# Patient Record
Sex: Female | Born: 1950 | Race: White | Hispanic: No | Marital: Single | Smoking: Never smoker
Health system: Southern US, Community
[De-identification: ages and names within clinical notes are randomized; demographics above are authoritative.]

## PROBLEM LIST (undated history)

## (undated) DIAGNOSIS — M8580 Other specified disorders of bone density and structure, unspecified site: Secondary | ICD-10-CM

## (undated) DIAGNOSIS — R7303 Prediabetes: Secondary | ICD-10-CM

## (undated) DIAGNOSIS — I499 Cardiac arrhythmia, unspecified: Secondary | ICD-10-CM

## (undated) DIAGNOSIS — E785 Hyperlipidemia, unspecified: Secondary | ICD-10-CM

## (undated) DIAGNOSIS — R600 Localized edema: Secondary | ICD-10-CM

## (undated) DIAGNOSIS — H04563 Stenosis of bilateral lacrimal punctum: Secondary | ICD-10-CM

## (undated) DIAGNOSIS — C801 Malignant (primary) neoplasm, unspecified: Secondary | ICD-10-CM

## (undated) DIAGNOSIS — Z9889 Other specified postprocedural states: Secondary | ICD-10-CM

## (undated) DIAGNOSIS — R42 Dizziness and giddiness: Secondary | ICD-10-CM

## (undated) DIAGNOSIS — Z972 Presence of dental prosthetic device (complete) (partial): Secondary | ICD-10-CM

## (undated) DIAGNOSIS — R739 Hyperglycemia, unspecified: Secondary | ICD-10-CM

## (undated) DIAGNOSIS — K219 Gastro-esophageal reflux disease without esophagitis: Secondary | ICD-10-CM

## (undated) DIAGNOSIS — C8590 Non-Hodgkin lymphoma, unspecified, unspecified site: Secondary | ICD-10-CM

## (undated) DIAGNOSIS — R112 Nausea with vomiting, unspecified: Secondary | ICD-10-CM

## (undated) DIAGNOSIS — I38 Endocarditis, valve unspecified: Secondary | ICD-10-CM

## (undated) DIAGNOSIS — N189 Chronic kidney disease, unspecified: Secondary | ICD-10-CM

## (undated) DIAGNOSIS — J45909 Unspecified asthma, uncomplicated: Secondary | ICD-10-CM

## (undated) DIAGNOSIS — M1990 Unspecified osteoarthritis, unspecified site: Secondary | ICD-10-CM

## (undated) DIAGNOSIS — G4730 Sleep apnea, unspecified: Secondary | ICD-10-CM

## (undated) DIAGNOSIS — H02103 Unspecified ectropion of right eye, unspecified eyelid: Secondary | ICD-10-CM

## (undated) DIAGNOSIS — H02106 Unspecified ectropion of left eye, unspecified eyelid: Secondary | ICD-10-CM

## (undated) DIAGNOSIS — I10 Essential (primary) hypertension: Secondary | ICD-10-CM

## (undated) DIAGNOSIS — M109 Gout, unspecified: Secondary | ICD-10-CM

## (undated) HISTORY — PX: COLONOSCOPY: SHX174

## (undated) HISTORY — PX: CHOLECYSTECTOMY: SHX55

## (undated) HISTORY — PX: ABDOMINAL HYSTERECTOMY: SHX81

## (undated) HISTORY — PX: ESOPHAGOGASTRODUODENOSCOPY: SHX1529

## (undated) HISTORY — PX: CARPAL TUNNEL RELEASE: SHX101

## (undated) HISTORY — PX: OTHER SURGICAL HISTORY: SHX169

---

## 2004-04-05 ENCOUNTER — Encounter: Payer: Self-pay | Admitting: Emergency Medicine

## 2004-05-19 ENCOUNTER — Encounter: Payer: Self-pay | Admitting: Family Medicine

## 2004-07-09 ENCOUNTER — Encounter: Payer: Self-pay | Admitting: Neurology

## 2004-08-06 ENCOUNTER — Encounter: Payer: Self-pay | Admitting: Neurology

## 2004-09-03 ENCOUNTER — Encounter: Payer: Self-pay | Admitting: Neurology

## 2005-10-28 ENCOUNTER — Ambulatory Visit: Payer: Self-pay | Admitting: Gastroenterology

## 2005-11-26 ENCOUNTER — Ambulatory Visit: Payer: Self-pay | Admitting: Gastroenterology

## 2006-03-30 ENCOUNTER — Emergency Department: Payer: Self-pay | Admitting: Emergency Medicine

## 2006-07-08 ENCOUNTER — Emergency Department: Payer: Self-pay | Admitting: Emergency Medicine

## 2007-02-25 ENCOUNTER — Ambulatory Visit: Payer: Self-pay | Admitting: Emergency Medicine

## 2007-02-25 ENCOUNTER — Emergency Department: Payer: Self-pay | Admitting: Unknown Physician Specialty

## 2007-11-07 ENCOUNTER — Observation Stay: Payer: Self-pay | Admitting: Internal Medicine

## 2007-11-30 ENCOUNTER — Ambulatory Visit: Payer: Self-pay | Admitting: Internal Medicine

## 2008-10-26 ENCOUNTER — Ambulatory Visit: Payer: Self-pay | Admitting: Internal Medicine

## 2008-12-05 ENCOUNTER — Ambulatory Visit: Payer: Self-pay | Admitting: Internal Medicine

## 2008-12-18 ENCOUNTER — Ambulatory Visit: Payer: Self-pay | Admitting: Internal Medicine

## 2009-06-19 ENCOUNTER — Ambulatory Visit: Payer: Self-pay | Admitting: Internal Medicine

## 2010-01-02 ENCOUNTER — Ambulatory Visit: Payer: Self-pay | Admitting: Family Medicine

## 2010-02-13 ENCOUNTER — Ambulatory Visit: Payer: Self-pay | Admitting: Family Medicine

## 2010-11-04 ENCOUNTER — Ambulatory Visit: Payer: Self-pay | Admitting: Family Medicine

## 2011-01-08 ENCOUNTER — Ambulatory Visit: Payer: Self-pay | Admitting: Family Medicine

## 2011-01-26 ENCOUNTER — Emergency Department: Payer: Self-pay | Admitting: Emergency Medicine

## 2011-02-03 ENCOUNTER — Inpatient Hospital Stay: Payer: Self-pay | Admitting: Internal Medicine

## 2011-02-10 ENCOUNTER — Encounter: Payer: Self-pay | Admitting: Unknown Physician Specialty

## 2012-01-22 ENCOUNTER — Ambulatory Visit: Payer: Self-pay | Admitting: Family Medicine

## 2012-02-18 ENCOUNTER — Emergency Department: Payer: Self-pay | Admitting: Emergency Medicine

## 2012-03-10 ENCOUNTER — Ambulatory Visit: Payer: Self-pay | Admitting: Internal Medicine

## 2012-11-17 ENCOUNTER — Emergency Department: Payer: Self-pay | Admitting: Emergency Medicine

## 2012-11-17 LAB — URINALYSIS, COMPLETE
Bacteria: NONE SEEN
Bilirubin,UR: NEGATIVE
Blood: NEGATIVE
Ketone: NEGATIVE
Ph: 5 (ref 4.5–8.0)
Protein: NEGATIVE
RBC,UR: <1 /HPF (ref 0–5)
Squamous Epithelial: 1
WBC UR: 1 /HPF (ref 0–5)

## 2012-11-17 LAB — CBC
HCT: 44.5 % (ref 35.0–47.0)
MCH: 29.8 pg (ref 26.0–34.0)
MCV: 88 fL (ref 80–100)
Platelet: 198 10*3/uL (ref 150–440)
RBC: 5.06 10*6/uL (ref 3.80–5.20)
RDW: 12.8 % (ref 11.5–14.5)
WBC: 8.3 10*3/uL (ref 3.6–11.0)

## 2012-11-17 LAB — BASIC METABOLIC PANEL
BUN: 21 mg/dL — ABNORMAL HIGH (ref 7–18)
Calcium, Total: 9.5 mg/dL (ref 8.5–10.1)
EGFR (African American): 52 — ABNORMAL LOW
Glucose: 118 mg/dL — ABNORMAL HIGH (ref 65–99)
Potassium: 4 mmol/L (ref 3.5–5.1)

## 2012-11-17 LAB — TSH: Thyroid Stimulating Horm: 4.36 u[IU]/mL

## 2013-01-28 DIAGNOSIS — I10 Essential (primary) hypertension: Secondary | ICD-10-CM | POA: Insufficient documentation

## 2013-01-28 DIAGNOSIS — K219 Gastro-esophageal reflux disease without esophagitis: Secondary | ICD-10-CM | POA: Insufficient documentation

## 2013-02-22 ENCOUNTER — Ambulatory Visit: Payer: Self-pay | Admitting: Family Medicine

## 2013-03-03 ENCOUNTER — Ambulatory Visit: Payer: Self-pay | Admitting: Family Medicine

## 2014-01-09 DIAGNOSIS — M109 Gout, unspecified: Secondary | ICD-10-CM | POA: Insufficient documentation

## 2014-03-20 ENCOUNTER — Ambulatory Visit: Payer: Self-pay | Admitting: Gastroenterology

## 2014-03-22 LAB — PATHOLOGY REPORT

## 2014-04-04 DIAGNOSIS — E782 Mixed hyperlipidemia: Secondary | ICD-10-CM | POA: Insufficient documentation

## 2014-05-10 DIAGNOSIS — J4530 Mild persistent asthma, uncomplicated: Secondary | ICD-10-CM | POA: Insufficient documentation

## 2014-06-06 DIAGNOSIS — N183 Chronic kidney disease, stage 3 unspecified: Secondary | ICD-10-CM | POA: Insufficient documentation

## 2014-10-04 DIAGNOSIS — R600 Localized edema: Secondary | ICD-10-CM | POA: Insufficient documentation

## 2015-01-15 ENCOUNTER — Other Ambulatory Visit: Payer: Self-pay | Admitting: Family Medicine

## 2015-01-15 DIAGNOSIS — Z1231 Encounter for screening mammogram for malignant neoplasm of breast: Secondary | ICD-10-CM

## 2015-01-17 ENCOUNTER — Other Ambulatory Visit: Payer: Self-pay | Admitting: Family Medicine

## 2015-02-11 ENCOUNTER — Encounter: Payer: Self-pay | Admitting: *Deleted

## 2015-02-11 ENCOUNTER — Ambulatory Visit: Payer: 59 | Attending: Oncology | Admitting: *Deleted

## 2015-02-11 VITALS — BP 122/72 | HR 58 | Temp 96.7°F | Resp 16 | Ht 63.39 in | Wt 149.9 lb

## 2015-02-11 DIAGNOSIS — N644 Mastodynia: Secondary | ICD-10-CM

## 2015-02-11 NOTE — Progress Notes (Signed)
Subjective:     Patient ID: Tracy Curry, female   DOB: [birth year], 64 y.o.   MRN: [id number]  HPI   Review of Systems     Objective:   Physical Exam  Pulmonary/Chest: Right breast exhibits no inverted nipple, no mass, no nipple discharge, no skin change and no tenderness. Left breast exhibits tenderness. Left breast exhibits no inverted nipple, no mass, no nipple discharge and no skin change. Breasts are asymmetrical.         Assessment:     64 year old White female presents to [facility] with complaints of left axillary and upper outer quadrant pain of the left breast.  States the pain is intermittent and has been present for months.  Rates the pain a 2 on a 0/10 scale.  No aggravating or alleviating factors.  Reviewed last mammogram from [place] and patient has a history of breast cyst.  Last mammogram was a birads 2.  On clinical breast exam there is targeted tenderness in the left axilla and upper outer quadrant of the left breast. The right breast is at least one cup size larger than the left. Taught self breast awareness. Patient has been screened for eligibility.  She does not have any insurance, Medicare or Medicaid.  She also meets financial eligibility.  Hand-out given on the Affordable Care Act.      Plan:     Bilateral diagnostic mammogram with ultrasound for targeted left breast and axillary pain on 02/13/15.  Will follow-up per protocol.

## 2015-02-11 NOTE — Patient Instructions (Signed)
Gave patient hand-out, Women Staying Healthy, Active and Well from [facility], with education on breast health, pap smears, heart and colon health. 

## 2015-02-13 ENCOUNTER — Ambulatory Visit
Admission: RE | Admit: 2015-02-13 | Discharge: 2015-02-13 | Disposition: A | Discharge status: Home / Self Care | Payer: Self-pay | Source: Ambulatory Visit | Attending: Oncology | Admitting: Oncology

## 2015-02-13 ENCOUNTER — Ambulatory Visit: Payer: Self-pay

## 2015-02-13 DIAGNOSIS — N644 Mastodynia: Secondary | ICD-10-CM | POA: Insufficient documentation

## 2015-02-18 NOTE — Progress Notes (Signed)
Reviewed normal mammogram and ultrasound results with the patient.  She is to call me back if the pain worsens, or there is any palpable findings.  She is agreeable.  Otherwise she is to follow-up in one year with annual screening.  HSIS to [place].

## 2016-01-02 ENCOUNTER — Other Ambulatory Visit: Payer: Self-pay | Admitting: Family Medicine

## 2016-01-02 DIAGNOSIS — Z1231 Encounter for screening mammogram for malignant neoplasm of breast: Secondary | ICD-10-CM

## 2016-02-17 ENCOUNTER — Ambulatory Visit
Admission: RE | Admit: 2016-02-17 | Discharge: 2016-02-17 | Disposition: A | Discharge status: Home / Self Care | Payer: PPO | Source: Ambulatory Visit | Attending: Family Medicine | Admitting: Family Medicine

## 2016-02-17 DIAGNOSIS — Z1231 Encounter for screening mammogram for malignant neoplasm of breast: Secondary | ICD-10-CM | POA: Insufficient documentation

## 2016-02-17 HISTORY — DX: Malignant (primary) neoplasm, unspecified: C80.1

## 2016-04-14 ENCOUNTER — Ambulatory Visit: Payer: PPO | Attending: Otolaryngology

## 2016-04-14 DIAGNOSIS — G4719 Other hypersomnia: Secondary | ICD-10-CM | POA: Diagnosis present

## 2016-04-14 DIAGNOSIS — R0683 Snoring: Secondary | ICD-10-CM | POA: Diagnosis not present

## 2016-04-14 DIAGNOSIS — G4733 Obstructive sleep apnea (adult) (pediatric): Secondary | ICD-10-CM | POA: Insufficient documentation

## 2016-06-17 ENCOUNTER — Other Ambulatory Visit: Payer: Self-pay | Admitting: Family Medicine

## 2016-06-17 DIAGNOSIS — R944 Abnormal results of kidney function studies: Secondary | ICD-10-CM

## 2016-06-17 DIAGNOSIS — R1012 Left upper quadrant pain: Secondary | ICD-10-CM

## 2016-06-17 DIAGNOSIS — D7389 Other diseases of spleen: Secondary | ICD-10-CM

## 2016-06-22 ENCOUNTER — Ambulatory Visit
Admission: RE | Admit: 2016-06-22 | Discharge: 2016-06-22 | Disposition: A | Discharge status: Home / Self Care | Payer: PPO | Source: Ambulatory Visit | Attending: Family Medicine | Admitting: Family Medicine

## 2016-06-22 DIAGNOSIS — R1012 Left upper quadrant pain: Secondary | ICD-10-CM | POA: Diagnosis present

## 2016-06-22 DIAGNOSIS — R944 Abnormal results of kidney function studies: Secondary | ICD-10-CM

## 2016-06-22 DIAGNOSIS — D7389 Other diseases of spleen: Secondary | ICD-10-CM

## 2016-12-14 ENCOUNTER — Encounter: Payer: Self-pay | Admitting: *Deleted

## 2016-12-15 ENCOUNTER — Encounter: Payer: Self-pay | Admitting: *Deleted

## 2016-12-15 ENCOUNTER — Ambulatory Visit
Admission: RE | Admit: 2016-12-15 | Discharge: 2016-12-15 | Disposition: A | Discharge status: Home / Self Care | Payer: Medicare PPO | Source: Ambulatory Visit | Attending: Gastroenterology | Admitting: Gastroenterology

## 2016-12-15 ENCOUNTER — Ambulatory Visit: Payer: Medicare PPO | Admitting: Anesthesiology

## 2016-12-15 ENCOUNTER — Encounter
Admission: RE | Disposition: A | Discharge status: Home / Self Care | Payer: Self-pay | Source: Ambulatory Visit | Attending: Gastroenterology

## 2016-12-15 DIAGNOSIS — Z7982 Long term (current) use of aspirin: Secondary | ICD-10-CM | POA: Diagnosis not present

## 2016-12-15 DIAGNOSIS — Z888 Allergy status to other drugs, medicaments and biological substances status: Secondary | ICD-10-CM | POA: Diagnosis not present

## 2016-12-15 DIAGNOSIS — Z882 Allergy status to sulfonamides status: Secondary | ICD-10-CM | POA: Diagnosis not present

## 2016-12-15 DIAGNOSIS — I129 Hypertensive chronic kidney disease with stage 1 through stage 4 chronic kidney disease, or unspecified chronic kidney disease: Secondary | ICD-10-CM | POA: Diagnosis not present

## 2016-12-15 DIAGNOSIS — N189 Chronic kidney disease, unspecified: Secondary | ICD-10-CM | POA: Insufficient documentation

## 2016-12-15 DIAGNOSIS — Z85828 Personal history of other malignant neoplasm of skin: Secondary | ICD-10-CM | POA: Insufficient documentation

## 2016-12-15 DIAGNOSIS — J45909 Unspecified asthma, uncomplicated: Secondary | ICD-10-CM | POA: Diagnosis not present

## 2016-12-15 DIAGNOSIS — M109 Gout, unspecified: Secondary | ICD-10-CM | POA: Insufficient documentation

## 2016-12-15 DIAGNOSIS — Z885 Allergy status to narcotic agent status: Secondary | ICD-10-CM | POA: Diagnosis not present

## 2016-12-15 DIAGNOSIS — Z881 Allergy status to other antibiotic agents status: Secondary | ICD-10-CM | POA: Insufficient documentation

## 2016-12-15 DIAGNOSIS — M8580 Other specified disorders of bone density and structure, unspecified site: Secondary | ICD-10-CM | POA: Diagnosis not present

## 2016-12-15 DIAGNOSIS — Z79899 Other long term (current) drug therapy: Secondary | ICD-10-CM | POA: Diagnosis not present

## 2016-12-15 DIAGNOSIS — K3189 Other diseases of stomach and duodenum: Secondary | ICD-10-CM | POA: Diagnosis not present

## 2016-12-15 DIAGNOSIS — Q398 Other congenital malformations of esophagus: Secondary | ICD-10-CM | POA: Insufficient documentation

## 2016-12-15 DIAGNOSIS — E785 Hyperlipidemia, unspecified: Secondary | ICD-10-CM | POA: Insufficient documentation

## 2016-12-15 DIAGNOSIS — K2950 Unspecified chronic gastritis without bleeding: Secondary | ICD-10-CM | POA: Diagnosis not present

## 2016-12-15 DIAGNOSIS — K210 Gastro-esophageal reflux disease with esophagitis: Secondary | ICD-10-CM | POA: Diagnosis not present

## 2016-12-15 DIAGNOSIS — R001 Bradycardia, unspecified: Secondary | ICD-10-CM | POA: Diagnosis not present

## 2016-12-15 DIAGNOSIS — R1013 Epigastric pain: Secondary | ICD-10-CM | POA: Diagnosis present

## 2016-12-15 DIAGNOSIS — Z880 Allergy status to penicillin: Secondary | ICD-10-CM | POA: Diagnosis not present

## 2016-12-15 HISTORY — DX: Localized edema: R60.0

## 2016-12-15 HISTORY — DX: Other specified disorders of bone density and structure, unspecified site: M85.80

## 2016-12-15 HISTORY — DX: Unspecified ectropion of right eye, unspecified eyelid: H02.103

## 2016-12-15 HISTORY — DX: Essential (primary) hypertension: I10

## 2016-12-15 HISTORY — DX: Stenosis of bilateral lacrimal punctum: H04.563

## 2016-12-15 HISTORY — DX: Gastro-esophageal reflux disease without esophagitis: K21.9

## 2016-12-15 HISTORY — DX: Unspecified asthma, uncomplicated: J45.909

## 2016-12-15 HISTORY — DX: Unspecified ectropion of left eye, unspecified eyelid: H02.106

## 2016-12-15 HISTORY — DX: Cardiac arrhythmia, unspecified: I49.9

## 2016-12-15 HISTORY — PX: ESOPHAGOGASTRODUODENOSCOPY (EGD) WITH PROPOFOL: SHX5813

## 2016-12-15 HISTORY — DX: Hyperlipidemia, unspecified: E78.5

## 2016-12-15 HISTORY — DX: Gout, unspecified: M10.9

## 2016-12-15 HISTORY — DX: Chronic kidney disease, unspecified: N18.9

## 2016-12-15 HISTORY — DX: Hyperglycemia, unspecified: R73.9

## 2016-12-15 HISTORY — DX: Endocarditis, valve unspecified: I38

## 2016-12-15 SURGERY — ESOPHAGOGASTRODUODENOSCOPY (EGD) WITH PROPOFOL
Anesthesia: General

## 2016-12-15 MED ORDER — MIDAZOLAM HCL 5 MG/5ML IJ SOLN
INTRAMUSCULAR | Status: DC | PRN
  Administered 2016-12-15: 1 mg via INTRAVENOUS

## 2016-12-15 MED ORDER — PROPOFOL 500 MG/50ML IV EMUL
INTRAVENOUS | Status: DC | PRN
  Administered 2016-12-15: 140 ug/kg/min via INTRAVENOUS

## 2016-12-15 MED ORDER — PROPOFOL 500 MG/50ML IV EMUL
INTRAVENOUS | Status: AC
  Filled 2016-12-15: qty 50

## 2016-12-15 MED ORDER — FENTANYL CITRATE (PF) 100 MCG/2ML IJ SOLN
INTRAMUSCULAR | Status: DC | PRN
  Administered 2016-12-15: 50 ug via INTRAVENOUS

## 2016-12-15 MED ORDER — SODIUM CHLORIDE 0.9 % IV SOLN
INTRAVENOUS | Status: DC
  Administered 2016-12-15: 1000 mL via INTRAVENOUS
  Administered 2016-12-15: 14:00:00 via INTRAVENOUS

## 2016-12-15 MED ORDER — FENTANYL CITRATE (PF) 100 MCG/2ML IJ SOLN
INTRAMUSCULAR | Status: AC
  Filled 2016-12-15: qty 2

## 2016-12-15 MED ORDER — MIDAZOLAM HCL 2 MG/2ML IJ SOLN
INTRAMUSCULAR | Status: AC
  Filled 2016-12-15: qty 2

## 2016-12-15 MED ORDER — PROPOFOL 10 MG/ML IV BOLUS
INTRAVENOUS | Status: DC | PRN
  Administered 2016-12-15: 100 mg via INTRAVENOUS

## 2016-12-15 MED ORDER — LIDOCAINE 2% (20 MG/ML) 5 ML SYRINGE
INTRAMUSCULAR | Status: DC | PRN
  Administered 2016-12-15: 40 mg via INTRAVENOUS

## 2016-12-15 NOTE — Transfer of Care (Signed)
Immediate Anesthesia Transfer of Care Note  Patient: Tracy Curry  Procedure(s) Performed: Procedure(s): ESOPHAGOGASTRODUODENOSCOPY (EGD) WITH PROPOFOL (N/A)  Patient Location: PACU and Endoscopy Unit  Anesthesia Type:General  Level of Consciousness: sedated  Airway & Oxygen Therapy: Patient Spontanous Breathing and Patient connected to nasal cannula oxygen  Post-op Assessment: Report given to RN and Post -op Vital signs reviewed and stable  Post vital signs: Reviewed and stable  Last Vitals:  Vitals:   12/15/16 1257  BP: (!) 168/72  Pulse: 68  Resp: 16  Temp: 36.7 C    Last Pain:  Vitals:   12/15/16 1257  TempSrc: Oral  PainSc: 8          Complications: No apparent anesthesia complications

## 2016-12-15 NOTE — Op Note (Signed)
[facility] Patient Name: Tracy Curry Procedure Date: 12/15/2016 2:16 PM MRN: [id number] Account #: [id number] Date of Birth: 03-16-1951 Admit Type: Outpatient Age: 66 Room: [facility] ENDO ROOM 3 Gender: Female Note Status: Finalized Procedure:            Upper GI endoscopy Indications:          Epigastric abdominal pain, Dyspepsia Providers:            Lollie Sails, MD Referring MD:         Lynnell Jude (Referring MD) Medicines:            Monitored Anesthesia Care Complications:        No immediate complications. Procedure:            Pre-Anesthesia Assessment:                       - ASA Grade Assessment: II - A patient with mild                        systemic disease.                       After obtaining informed consent, the endoscope was                        passed under direct vision. Throughout the procedure,                        the patient's blood pressure, pulse, and oxygen                        saturations were monitored continuously. The Endoscope                        was introduced through the mouth, and advanced to the                        third part of duodenum. The upper GI endoscopy was                        accomplished without difficulty. The patient tolerated                        the procedure well. Findings:      The Z-line was variable. Biopsies were taken with a cold forceps for       histology.      The lower third of the esophagus was moderately tortuous.      Diffuse and patchy mild inflammation characterized by congestion (edema)       and erythema was found in the gastric body and in the gastric antrum.      The cardia and gastric fundus were normal on retroflexion.      The examined duodenum was normal. Impression:           - Z-line variable. Biopsied.                       - Tortuous esophagus.                       - Bile gastritis.                       -  Normal examined  duodenum. Recommendation:       - Use Protonix (pantoprazole) 40 mg PO daily daily.                       - Use sucralfate tablets 1 gram PO BID.                       - Return to GI clinic in 5 weeks. Procedure Code(s):    --- Professional ---                       [phone], Esophagogastroduodenoscopy, flexible, transoral;                        with biopsy, single or multiple Diagnosis Code(s):    --- Professional ---                       K22.8, Other specified diseases of esophagus                       Q39.9, Congenital malformation of esophagus, unspecified                       K29.60, Other gastritis without bleeding                       R10.13, Epigastric pain CPT copyright 2016 American Medical Association. All rights reserved. The codes documented in this report are preliminary and upon coder review may  be revised to meet current compliance requirements. Lollie Sails, MD 12/15/2016 2:51:27 PM This report has been signed electronically. Number of Addenda: 0 Note Initiated On: 12/15/2016 2:16 PM      [facility]

## 2016-12-15 NOTE — Anesthesia Post-op Follow-up Note (Cosign Needed)
Anesthesia QCDR form completed.        

## 2016-12-15 NOTE — Anesthesia Preprocedure Evaluation (Signed)
Anesthesia Evaluation  Patient identified by MRN, date of birth, ID band Patient awake    Reviewed: Allergy & Precautions  Airway Mallampati: II       Dental  (+) Upper Dentures, Lower Dentures   Pulmonary asthma ,    breath sounds clear to auscultation       Cardiovascular Exercise Tolerance: Good hypertension, Pt. on medications + dysrhythmias  Rhythm:Regular Rate:Bradycardia     Neuro/Psych negative neurological ROS     GI/Hepatic Neg liver ROS, GERD  Medicated,  Endo/Other  negative endocrine ROS  Renal/GU      Musculoskeletal negative musculoskeletal ROS (+)   Abdominal Normal abdominal exam  (+)   Peds  Hematology negative hematology ROS (+)   Anesthesia Other Findings   Reproductive/Obstetrics                             Anesthesia Physical Anesthesia Plan  ASA: II  Anesthesia Plan: General   Post-op Pain Management:    Induction: Intravenous  PONV Risk Score and Plan: 1 and Ondansetron  Airway Management Planned: Natural Airway  Additional Equipment:   Intra-op Plan:   Post-operative Plan:   Informed Consent: I have reviewed the patients History and Physical, chart, labs and discussed the procedure including the risks, benefits and alternatives for the proposed anesthesia with the patient or authorized representative who has indicated his/her understanding and acceptance.     Plan Discussed with: CRNA  Anesthesia Plan Comments:         Anesthesia Quick Evaluation

## 2016-12-15 NOTE — Anesthesia Postprocedure Evaluation (Signed)
Anesthesia Post Note  Patient: Tracy Curry  Procedure(s) Performed: Procedure(s) (LRB): ESOPHAGOGASTRODUODENOSCOPY (EGD) WITH PROPOFOL (N/A)  Patient location during evaluation: Endoscopy Anesthesia Type: General Level of consciousness: awake and alert Pain management: pain level controlled Vital Signs Assessment: post-procedure vital signs reviewed and stable Respiratory status: spontaneous breathing, nonlabored ventilation, respiratory function stable and patient connected to nasal cannula oxygen Cardiovascular status: blood pressure returned to baseline and stable Postop Assessment: no signs of nausea or vomiting Anesthetic complications: no     Last Vitals:  Vitals:   12/15/16 1515 12/15/16 1525  BP: (!) 144/53 93/80  Pulse: 63 65  Resp: 16 17  Temp:      Last Pain:  Vitals:   12/15/16 1455  TempSrc: Tympanic  PainSc:                  [name]

## 2016-12-15 NOTE — H&P (Signed)
Outpatient short stay form Pre-procedure 12/15/2016 2:06 PM Lollie Sails MD  Primary Physician: Dr. Lavera Guise  Reason for visit:  EGD  History of present illness:  Patient is a 66 year old female presenting today as above. She has a history of having chronic gastritis in the past. She presents with epigastric pain and dyspepsia. There is no nausea or vomiting. She does take occasional NSAID. She does take 81 mg aspirin daily that is been held for several days. Patient had been taking Pepcid however was recently changed to pantoprazole 40 mg today this has decreased her symptoms from a level X to a level II.    Current Facility-Administered Medications:  .  0.9 %  sodium chloride infusion, , Intravenous, Continuous, Lollie Sails, MD, Last Rate: 20 mL/hr at 12/15/16 1314  Prescriptions Prior to Admission  Medication Sig Dispense Refill Last Dose  . allopurinol (ZYLOPRIM) 300 MG tablet Take 300 mg by mouth daily.     Marland Kitchen amLODipine (NORVASC) 10 MG tablet Take 10 mg by mouth daily.   12/15/2016 at 0315  . aspirin EC 81 MG tablet Take 81 mg by mouth daily.     . calcium carbonate (OSCAL) 1500 (600 Ca) MG TABS tablet Take by mouth 2 (two) times daily with a meal.     . cloNIDine (CATAPRES - DOSED IN MG/24 HR) 0.2 mg/24hr patch Place 0.2 mg onto the skin once a week.   12/15/2016 at 0315  . famotidine (PEPCID) 40 MG tablet Take 40 mg by mouth daily.     Marland Kitchen ketotifen (ZADITOR) 0.025 % ophthalmic solution 1 drop 2 (two) times daily.     . magnesium oxide (MAG-OX) 400 MG tablet Take 400 mg by mouth daily.     . metoprolol succinate (TOPROL-XL) 25 MG 24 hr tablet Take 25 mg by mouth daily.   12/14/2016 at Unknown time  . Multiple Vitamin (MULTIVITAMIN) tablet Take 1 tablet by mouth daily.     . niacin 500 MG tablet Take 500 mg by mouth at bedtime.     . OMEGA-3 FATTY ACIDS-VITAMIN E PO Take 1,200 mg by mouth daily.     . prednisoLONE acetate (PRED FORTE) 1 % ophthalmic suspension 1 drop 4  (four) times daily.     . [DISCONTINUED] RABEprazole (ACIPHEX) 20 MG tablet Take 20 mg by mouth daily.        Allergies  Allergen Reactions  . Gabapentin Rash    Other Reaction: GI UPSET  . Oxycodone-Acetaminophen Rash  . Ace Inhibitors Other (See Comments)  . Macrolides And Ketolides Other (See Comments)  . Pantoprazole Other (See Comments)    Abdominal pain  . Penicillins Hives  . Petrolatum-Zinc Oxide Nausea And Vomiting  . Propylene Glycol Hives  . Sulfa Antibiotics Other (See Comments)    unknown  . Tenex [Guanfacine Hcl] Other (See Comments)  . Tetracycline Nausea And Vomiting  . Amlactin [Ammonium Lactate] Rash  . Bacitracin Rash    Allergen test  . Benzyl Alcohol Rash    Allergen test  . Cetyl Alcohol Rash    Allergen test  . Erythromycin Rash    Other Reaction: OTHER REACTION-MOUTH SORES  . Lanolin Rash    Allergen test  . Neomycin Rash    Allergen test  . Petrolatum Rash  . Theraseal [Simethicone] Rash  . Ureacin [Urea] Rash     Past Medical History:  Diagnosis Date  . Asthma   . Bilateral leg edema   . Cancer ([place])  skin ca  . Chronic kidney disease    stage 3  . Dysrhythmia    bradycardia  . Ectropion of both eyes   . GERD (gastroesophageal reflux disease)   . Gout   . Hyperglycemia   . Hyperlipidemia   . Hypertension   . Osteopenia   . Stenosis of lacrimal punctum, bilateral   . VHD (valvular heart disease)     Review of systems:      Physical Exam    Heart and lungs: Regular rate and rhythm without rub or gallop, lungs are bilaterally clear.    HEENT: Normocephalic atraumatic eyes are anicteric    Other:     Pertinant exam for procedure: Soft mild tenderness throughout the abdomen and mostly in the lower epigastric region. nondistended, bowel sounds are positive normoactive    Planned proceedures: EGD and indicated procedures. I have discussed the risks benefits and complications of procedures to include not limited to  bleeding, infection, perforation and the risk of sedation and the patient wishes to proceed.    Lollie Sails, MD Gastroenterology 12/15/2016  2:06 PM

## 2016-12-16 ENCOUNTER — Encounter: Payer: Self-pay | Admitting: Gastroenterology

## 2016-12-18 LAB — SURGICAL PATHOLOGY

## 2017-01-07 ENCOUNTER — Other Ambulatory Visit: Payer: Self-pay | Admitting: Family Medicine

## 2017-01-07 DIAGNOSIS — Z1231 Encounter for screening mammogram for malignant neoplasm of breast: Secondary | ICD-10-CM

## 2017-01-25 ENCOUNTER — Other Ambulatory Visit: Payer: Self-pay | Admitting: Gastroenterology

## 2017-01-25 DIAGNOSIS — R1084 Generalized abdominal pain: Secondary | ICD-10-CM

## 2017-01-28 ENCOUNTER — Ambulatory Visit
Admission: RE | Admit: 2017-01-28 | Discharge: 2017-01-28 | Disposition: A | Discharge status: Home / Self Care | Payer: Medicare PPO | Source: Ambulatory Visit | Attending: Gastroenterology | Admitting: Gastroenterology

## 2017-01-28 ENCOUNTER — Other Ambulatory Visit
Admission: RE | Admit: 2017-01-28 | Discharge: 2017-01-28 | Disposition: A | Discharge status: Home / Self Care | Payer: Medicare PPO | Source: Ambulatory Visit | Attending: Gastroenterology | Admitting: Gastroenterology

## 2017-01-28 DIAGNOSIS — I700 Atherosclerosis of aorta: Secondary | ICD-10-CM | POA: Diagnosis not present

## 2017-01-28 DIAGNOSIS — K5750 Diverticulosis of both small and large intestine without perforation or abscess without bleeding: Secondary | ICD-10-CM | POA: Insufficient documentation

## 2017-01-28 DIAGNOSIS — R1084 Generalized abdominal pain: Secondary | ICD-10-CM

## 2017-01-28 LAB — CREATININE, SERUM
Creatinine, Ser: 0.91 mg/dL (ref 0.44–1.00)
GFR calc non Af Amer: >60 mL/min (ref >60)

## 2017-01-28 MED ORDER — IOPAMIDOL (ISOVUE-300) INJECTION 61%
100.0000 mL | Freq: Once | INTRAVENOUS | Status: AC | PRN
  Administered 2017-01-28: 100 mL via INTRAVENOUS

## 2017-02-23 ENCOUNTER — Ambulatory Visit
Admission: RE | Admit: 2017-02-23 | Discharge: 2017-02-23 | Disposition: A | Discharge status: Home / Self Care | Payer: Medicare PPO | Source: Ambulatory Visit | Attending: Family Medicine | Admitting: Family Medicine

## 2017-02-23 DIAGNOSIS — Z1231 Encounter for screening mammogram for malignant neoplasm of breast: Secondary | ICD-10-CM | POA: Diagnosis not present

## 2017-09-09 ENCOUNTER — Ambulatory Visit: Payer: Medicare HMO | Admitting: Gastroenterology

## 2017-10-21 DIAGNOSIS — C8590 Non-Hodgkin lymphoma, unspecified, unspecified site: Secondary | ICD-10-CM

## 2017-10-21 HISTORY — DX: Non-Hodgkin lymphoma, unspecified, unspecified site: C85.90

## 2017-11-23 DIAGNOSIS — C8580 Other specified types of non-Hodgkin lymphoma, unspecified site: Secondary | ICD-10-CM | POA: Insufficient documentation

## 2018-01-17 ENCOUNTER — Other Ambulatory Visit: Payer: Self-pay | Admitting: Family Medicine

## 2018-01-17 DIAGNOSIS — Z1231 Encounter for screening mammogram for malignant neoplasm of breast: Secondary | ICD-10-CM

## 2018-02-24 ENCOUNTER — Encounter (INDEPENDENT_AMBULATORY_CARE_PROVIDER_SITE_OTHER): Payer: Self-pay

## 2018-02-24 ENCOUNTER — Ambulatory Visit
Admission: RE | Admit: 2018-02-24 | Discharge: 2018-02-24 | Disposition: A | Discharge status: Home / Self Care | Payer: Medicare HMO | Source: Ambulatory Visit | Attending: Family Medicine | Admitting: Family Medicine

## 2018-02-24 DIAGNOSIS — Z1231 Encounter for screening mammogram for malignant neoplasm of breast: Secondary | ICD-10-CM | POA: Diagnosis not present

## 2018-02-24 HISTORY — DX: Non-Hodgkin lymphoma, unspecified, unspecified site: C85.90

## 2018-03-03 ENCOUNTER — Encounter: Payer: Self-pay | Admitting: *Deleted

## 2018-03-03 ENCOUNTER — Other Ambulatory Visit: Payer: Self-pay

## 2018-03-04 NOTE — Anesthesia Preprocedure Evaluation (Deleted)

## 2018-03-08 DIAGNOSIS — Z9989 Dependence on other enabling machines and devices: Secondary | ICD-10-CM | POA: Diagnosis not present

## 2018-03-08 DIAGNOSIS — D1809 Hemangioma of other sites: Secondary | ICD-10-CM | POA: Diagnosis not present

## 2018-03-08 DIAGNOSIS — I10 Essential (primary) hypertension: Secondary | ICD-10-CM

## 2018-03-08 DIAGNOSIS — Z7982 Long term (current) use of aspirin: Secondary | ICD-10-CM | POA: Diagnosis not present

## 2018-03-08 DIAGNOSIS — Z8719 Personal history of other diseases of the digestive system: Secondary | ICD-10-CM | POA: Diagnosis not present

## 2018-03-08 DIAGNOSIS — Z882 Allergy status to sulfonamides status: Secondary | ICD-10-CM | POA: Diagnosis not present

## 2018-03-08 DIAGNOSIS — Z888 Allergy status to other drugs, medicaments and biological substances status: Secondary | ICD-10-CM | POA: Diagnosis not present

## 2018-03-08 DIAGNOSIS — J45909 Unspecified asthma, uncomplicated: Secondary | ICD-10-CM | POA: Diagnosis not present

## 2018-03-08 DIAGNOSIS — K219 Gastro-esophageal reflux disease without esophagitis: Secondary | ICD-10-CM | POA: Diagnosis not present

## 2018-03-08 DIAGNOSIS — M1990 Unspecified osteoarthritis, unspecified site: Secondary | ICD-10-CM | POA: Diagnosis not present

## 2018-03-08 DIAGNOSIS — Z85828 Personal history of other malignant neoplasm of skin: Secondary | ICD-10-CM | POA: Diagnosis not present

## 2018-03-08 DIAGNOSIS — E7800 Pure hypercholesterolemia, unspecified: Secondary | ICD-10-CM | POA: Diagnosis not present

## 2018-03-08 DIAGNOSIS — Z880 Allergy status to penicillin: Secondary | ICD-10-CM | POA: Diagnosis not present

## 2018-03-08 DIAGNOSIS — G4730 Sleep apnea, unspecified: Secondary | ICD-10-CM | POA: Diagnosis not present

## 2018-03-08 DIAGNOSIS — J349 Unspecified disorder of nose and nasal sinuses: Secondary | ICD-10-CM | POA: Diagnosis present

## 2018-03-08 DIAGNOSIS — Z79899 Other long term (current) drug therapy: Secondary | ICD-10-CM | POA: Diagnosis not present

## 2018-03-09 NOTE — Discharge Instructions (Signed)
General Anesthesia, Adult, Care After °These instructions provide you with information about caring for yourself after your procedure. Your health care provider may also give you more specific instructions. Your treatment has been planned according to current medical practices, but problems sometimes occur. Call your health care provider if you have any problems or questions after your procedure. °What can I expect after the procedure? °After the procedure, it is common to have: °· Vomiting. °· A sore throat. °· Mental slowness. ° °It is common to feel: °· Nauseous. °· Cold or shivery. °· Sleepy. °· Tired. °· Sore or achy, even in parts of your body where you did not have surgery. ° °Follow these instructions at home: °For at least 24 hours after the procedure: °· Do not: °? Participate in activities where you could fall or become injured. °? Drive. °? Use heavy machinery. °? Drink alcohol. °? Take sleeping pills or medicines that cause drowsiness. °? Make important decisions or sign legal documents. °? Take care of children on your own. °· Rest. °Eating and drinking °· If you vomit, drink water, juice, or soup when you can drink without vomiting. °· Drink enough fluid to keep your urine clear or pale yellow. °· Make sure you have little or no nausea before eating solid foods. °· Follow the diet recommended by your health care provider. °General instructions °· Have a responsible adult stay with you until you are awake and alert. °· Return to your normal activities as told by your health care provider. Ask your health care provider what activities are safe for you. °· Take over-the-counter and prescription medicines only as told by your health care provider. °· If you smoke, do not smoke without supervision. °· Keep all follow-up visits as told by your health care provider. This is important. °Contact a health care provider if: °· You continue to have nausea or vomiting at home, and medicines are not helpful. °· You  cannot drink fluids or start eating again. °· You cannot urinate after 8-12 hours. °· You develop a skin rash. °· You have fever. °· You have increasing redness at the site of your procedure. °Get help right away if: °· You have difficulty breathing. °· You have chest pain. °· You have unexpected bleeding. °· You feel that you are having a life-threatening or urgent problem. °This information is not intended to replace advice given to you by your health care provider. Make sure you discuss any questions you have with your health care provider. °Document Released: 09/28/2000 Document Revised: 11/25/2015 Document Reviewed: 06/06/2015 °Elsevier Interactive Patient Education © 2018 [employer] Inc. ° °

## 2018-03-10 ENCOUNTER — Encounter
Admission: RE | Disposition: A | Discharge status: Home / Self Care | Payer: Self-pay | Source: Ambulatory Visit | Attending: Otolaryngology

## 2018-03-10 ENCOUNTER — Ambulatory Visit: Payer: Medicare HMO | Admitting: Anesthesiology

## 2018-03-10 ENCOUNTER — Ambulatory Visit
Admission: RE | Admit: 2018-03-10 | Discharge: 2018-03-10 | Disposition: A | Discharge status: Home / Self Care | Payer: Medicare HMO | Source: Ambulatory Visit | Attending: Otolaryngology | Admitting: Otolaryngology

## 2018-03-10 DIAGNOSIS — Z85828 Personal history of other malignant neoplasm of skin: Secondary | ICD-10-CM | POA: Insufficient documentation

## 2018-03-10 DIAGNOSIS — G4730 Sleep apnea, unspecified: Secondary | ICD-10-CM | POA: Insufficient documentation

## 2018-03-10 DIAGNOSIS — I10 Essential (primary) hypertension: Secondary | ICD-10-CM | POA: Insufficient documentation

## 2018-03-10 DIAGNOSIS — Z7982 Long term (current) use of aspirin: Secondary | ICD-10-CM | POA: Insufficient documentation

## 2018-03-10 DIAGNOSIS — Z9989 Dependence on other enabling machines and devices: Secondary | ICD-10-CM | POA: Insufficient documentation

## 2018-03-10 DIAGNOSIS — Z79899 Other long term (current) drug therapy: Secondary | ICD-10-CM | POA: Insufficient documentation

## 2018-03-10 DIAGNOSIS — K219 Gastro-esophageal reflux disease without esophagitis: Secondary | ICD-10-CM | POA: Insufficient documentation

## 2018-03-10 DIAGNOSIS — J45909 Unspecified asthma, uncomplicated: Secondary | ICD-10-CM | POA: Insufficient documentation

## 2018-03-10 DIAGNOSIS — Z882 Allergy status to sulfonamides status: Secondary | ICD-10-CM | POA: Insufficient documentation

## 2018-03-10 DIAGNOSIS — D1809 Hemangioma of other sites: Secondary | ICD-10-CM | POA: Insufficient documentation

## 2018-03-10 DIAGNOSIS — M1990 Unspecified osteoarthritis, unspecified site: Secondary | ICD-10-CM | POA: Insufficient documentation

## 2018-03-10 DIAGNOSIS — Z888 Allergy status to other drugs, medicaments and biological substances status: Secondary | ICD-10-CM | POA: Insufficient documentation

## 2018-03-10 DIAGNOSIS — E7800 Pure hypercholesterolemia, unspecified: Secondary | ICD-10-CM | POA: Insufficient documentation

## 2018-03-10 DIAGNOSIS — Z880 Allergy status to penicillin: Secondary | ICD-10-CM | POA: Insufficient documentation

## 2018-03-10 DIAGNOSIS — Z8719 Personal history of other diseases of the digestive system: Secondary | ICD-10-CM | POA: Insufficient documentation

## 2018-03-10 HISTORY — DX: Sleep apnea, unspecified: G47.30

## 2018-03-10 HISTORY — DX: Nausea with vomiting, unspecified: R11.2

## 2018-03-10 HISTORY — DX: Unspecified osteoarthritis, unspecified site: M19.90

## 2018-03-10 HISTORY — DX: Presence of dental prosthetic device (complete) (partial): Z97.2

## 2018-03-10 HISTORY — DX: Dizziness and giddiness: R42

## 2018-03-10 HISTORY — PX: MASS EXCISION: SHX2000

## 2018-03-10 HISTORY — DX: Other specified postprocedural states: Z98.890

## 2018-03-10 SURGERY — EXCISION MASS
Anesthesia: General | Site: Nose | Laterality: Right | Wound class: "Clean Contaminated "

## 2018-03-10 MED ORDER — LACTATED RINGERS IV SOLN
INTRAVENOUS | Status: DC
  Administered 2018-03-10: 08:00:00 via INTRAVENOUS

## 2018-03-10 MED ORDER — PHENYLEPHRINE HCL 0.5 % NA SOLN
NASAL | Status: DC | PRN
  Administered 2018-03-10: 30 mL

## 2018-03-10 MED ORDER — DEXAMETHASONE SODIUM PHOSPHATE 4 MG/ML IJ SOLN
INTRAMUSCULAR | Status: DC | PRN
  Administered 2018-03-10: 8 mg via INTRAVENOUS

## 2018-03-10 MED ORDER — ACETAMINOPHEN 10 MG/ML IV SOLN
1000.0000 mg | Freq: Once | INTRAVENOUS | Status: AC
  Administered 2018-03-10: 1000 mg via INTRAVENOUS

## 2018-03-10 MED ORDER — LIDOCAINE-EPINEPHRINE 1 %-1:100000 IJ SOLN
INTRAMUSCULAR | Status: DC | PRN
  Administered 2018-03-10: 1 mL

## 2018-03-10 MED ORDER — FENTANYL CITRATE (PF) 100 MCG/2ML IJ SOLN
INTRAMUSCULAR | Status: DC | PRN
  Administered 2018-03-10: 25 ug via INTRAVENOUS

## 2018-03-10 MED ORDER — LIDOCAINE HCL (CARDIAC) PF 100 MG/5ML IV SOSY
PREFILLED_SYRINGE | INTRAVENOUS | Status: DC | PRN
  Administered 2018-03-10: 40 mg via INTRATRACHEAL

## 2018-03-10 MED ORDER — PROPOFOL 10 MG/ML IV BOLUS
INTRAVENOUS | Status: DC | PRN
  Administered 2018-03-10: 100 mg via INTRAVENOUS

## 2018-03-10 MED ORDER — GLYCOPYRROLATE 0.2 MG/ML IJ SOLN
INTRAMUSCULAR | Status: DC | PRN
  Administered 2018-03-10: 0.1 mg via INTRAVENOUS

## 2018-03-10 MED ORDER — ONDANSETRON HCL 4 MG/2ML IJ SOLN
INTRAMUSCULAR | Status: DC | PRN
  Administered 2018-03-10: 4 mg via INTRAVENOUS

## 2018-03-10 MED ORDER — SCOPOLAMINE 1 MG/3DAYS TD PT72
1.0000 | MEDICATED_PATCH | Freq: Once | TRANSDERMAL | Status: DC
  Administered 2018-03-10: 1.5 mg via TRANSDERMAL

## 2018-03-10 MED ORDER — MIDAZOLAM HCL 5 MG/5ML IJ SOLN
INTRAMUSCULAR | Status: DC | PRN
  Administered 2018-03-10: 1 mg via INTRAVENOUS

## 2018-03-10 SURGICAL SUPPLY — 16 items
"PENCIL ELECTRO HAND CTR " (MISCELLANEOUS) IMPLANT
DRAPE HEAD BAR (DRAPES) ×2 IMPLANT
ELECT CAUTERY BLADE TIP 2.5 (TIP) ×2
ELECT CAUTERY NDL 2.0 MIC (NEEDLE) ×1 IMPLANT
ELECT CAUTERY NEEDLE 2.0 MIC (NEEDLE) ×2 IMPLANT
ELECT REM PT RETURN 9FT ADLT (ELECTROSURGICAL) ×2
ELECTRODE CAUTERY BLDE TIP 2.5 (TIP) IMPLANT
ELECTRODE REM PT RTRN 9FT ADLT (ELECTROSURGICAL) ×1 IMPLANT
GLOVE PI ULTRA LF STRL 7.5 (GLOVE) ×2 IMPLANT
GLOVE PI ULTRA NON LATEX 7.5 (GLOVE) ×3
KIT TURNOVER KIT A (KITS) ×2 IMPLANT
NS IRRIG 500ML POUR BTL (IV SOLUTION) ×2 IMPLANT
PACK DRAPE NASAL/ENT (PACKS) ×2 IMPLANT
PATTIES SURGICAL .5 X3 (DISPOSABLE) ×1 IMPLANT
PENCIL ELECTRO HAND CTR (MISCELLANEOUS) ×2 IMPLANT
STRAP BODY AND KNEE 60X3 (MISCELLANEOUS) ×2 IMPLANT

## 2018-03-10 NOTE — Transfer of Care (Signed)
Immediate Anesthesia Transfer of Care Note  Patient: Tracy Curry  Procedure(s) Performed: EXCISION MASS OF RIGHT SEPTAL LESION (Right Nose)  Patient Location: PACU  Anesthesia Type: General ETT  Level of Consciousness: awake, alert  and patient cooperative  Airway and Oxygen Therapy: Patient Spontanous Breathing and Patient connected to supplemental oxygen  Post-op Assessment: Post-op Vital signs reviewed, Patient's Cardiovascular Status Stable, Respiratory Function Stable, Patent Airway and No signs of Nausea or vomiting  Post-op Vital Signs: Reviewed and stable  Complications: No apparent anesthesia complications

## 2018-03-10 NOTE — H&P (Signed)
H&P has been reviewedand patient reevaluated,  and no changes necessary. To be downloaded later.  

## 2018-03-10 NOTE — Op Note (Signed)
03/10/2018  9:27 AM    Tracy Curry  [id number]   Pre-Op Dx: Right anterior septal mass  Post-op Dx: Same  Proc: Excision right anterior septal mass  Surg:  Huey Romans  Anes:  Gen  EBL: Minimal  Comp: None  Findings: Granular mass attached to right anterior superior septum.  It has a small stalk approximately 5 mm in diameter and the mass is 1 x 1-1/2 cm in size.  The entire mass was sent for permanent section  Procedure: The patient was brought to the operating room and given general anesthesia by laryngeal mask.  When she was asleep the nose was prepped using 2 mL of 1% Xylocaine with epi 1: 100,000 for infiltration of the anterior nasal septum on the right side.  Cotton pledgets soaked in phenylephrine and Xylocaine were then placed over the septum and mass on the right side as well.  She was prepped and draped in sterile fashion.  The cotton pledgets were removed and the mass was visualized.  This was mushroom-shaped with a large wall and a small stalk on one side attached to the septum.  The incision was created at the septum and removing the mucosa off of the cartilage.  A Freer elevator was used to elevate this up and the remaining attachments around the were freed.  The specimen was sent for permanent section.  There are couple small bleeding areas around the mucosa where it was removed.  These were controlled with electrocautery.  There is no sign of any lesions anywhere and the septum was relatively straight.  Mucosa was intact and normal on the left side.  There was a small area about 4 mm by 4 mm where the septal cartilage was exposed on the right side.  The rest of the airway was clear further back.  The patient tolerated the procedure well.  She was awakened taken to the recovery room in satisfactory condition.  There were no operative complications.  Dispo:   To PACU to be discharged home  Plan: To follow-up in the office in 1 week for reevaluation of her nose  and to go over the pathology report.  She can use a small amount of ointment in the nose to help lubricate this area so it does not get large dry scabs  Huey Romans  03/10/2018 9:27 AM

## 2018-03-10 NOTE — Anesthesia Preprocedure Evaluation (Signed)
Anesthesia Evaluation  Patient identified by MRN, date of birth, ID band Patient awake    Reviewed: Allergy & Precautions, H&P , NPO status , Patient's Chart, lab work & pertinent test results  Airway Mallampati: III  TM Distance: >3 FB Neck ROM: full    Dental no notable dental hx. (+) Upper Dentures   Pulmonary asthma , sleep apnea and Continuous Positive Airway Pressure Ventilation ,    Pulmonary exam normal breath sounds clear to auscultation       Cardiovascular hypertension, Normal cardiovascular exam Rhythm:regular Rate:Normal     Neuro/Psych    GI/Hepatic GERD  ,  Endo/Other    Renal/GU Renal disease     Musculoskeletal   Abdominal   Peds  Hematology   Anesthesia Other Findings   Reproductive/Obstetrics                             Anesthesia Physical Anesthesia Plan  ASA: III  Anesthesia Plan: General ETT   Post-op Pain Management:    Induction:   PONV Risk Score and Plan: 3 and Ondansetron, Scopolamine patch - Pre-op and Treatment may vary due to age or medical condition  Airway Management Planned:   Additional Equipment:   Intra-op Plan:   Post-operative Plan:   Informed Consent: I have reviewed the patients History and Physical, chart, labs and discussed the procedure including the risks, benefits and alternatives for the proposed anesthesia with the patient or authorized representative who has indicated his/her understanding and acceptance.     Plan Discussed with: CRNA  Anesthesia Plan Comments:         Anesthesia Quick Evaluation

## 2018-03-10 NOTE — Anesthesia Postprocedure Evaluation (Signed)
Anesthesia Post Note  Patient: Tracy Curry  Procedure(s) Performed: EXCISION MASS OF RIGHT SEPTAL LESION (Right Nose)  Patient location during evaluation: PACU Anesthesia Type: General Level of consciousness: awake and alert and oriented Pain management: satisfactory to patient Vital Signs Assessment: post-procedure vital signs reviewed and stable Respiratory status: spontaneous breathing, nonlabored ventilation and respiratory function stable Cardiovascular status: blood pressure returned to baseline and stable Postop Assessment: Adequate PO intake and No signs of nausea or vomiting Anesthetic complications: no    Raliegh Ip

## 2018-03-10 NOTE — Progress Notes (Signed)
Called and spoke with Dr. Kathyrn Sheriff to inquire when patient could restart using her nasal CPAP mask after surgery, he verbalized that patient may begin using nasal mask tonight. This was relayed to patient and her friend, Bethena Roys, both verbalized understanding.

## 2018-03-10 NOTE — Anesthesia Procedure Notes (Signed)
Procedure Name: Intubation Date/Time: 03/10/2018 9:04 AM Performed by: Mayme Genta, CRNA Pre-anesthesia Checklist: Patient identified, Emergency Drugs available, Suction available, Patient being monitored and Timeout performed Patient Re-evaluated:Patient Re-evaluated prior to induction Oxygen Delivery Method: Circle system utilized Preoxygenation: Pre-oxygenation with 100% oxygen Induction Type: IV induction Ventilation: Mask ventilation without difficulty LMA: LMA inserted LMA Size: 4.0 Number of attempts: 1 Placement Confirmation: ETT inserted through vocal cords under direct vision,  positive ETCO2 and breath sounds checked- equal and bilateral Tube secured with: Tape Dental Injury: Teeth and Oropharynx as per pre-operative assessment

## 2018-03-11 ENCOUNTER — Encounter: Payer: Self-pay | Admitting: Otolaryngology

## 2018-03-14 LAB — SURGICAL PATHOLOGY

## 2018-07-15 IMAGING — US US RENAL
1 series · 14 of 25 positions shown · non-contrast
Comparison: None.

CLINICAL DATA: Abnormal renal function tests

EXAM:
RENAL / URINARY TRACT ULTRASOUND COMPLETE

[Series 1: us renal · 0.23mm/px · 14 of 35 slices shown]
[im 1/35]
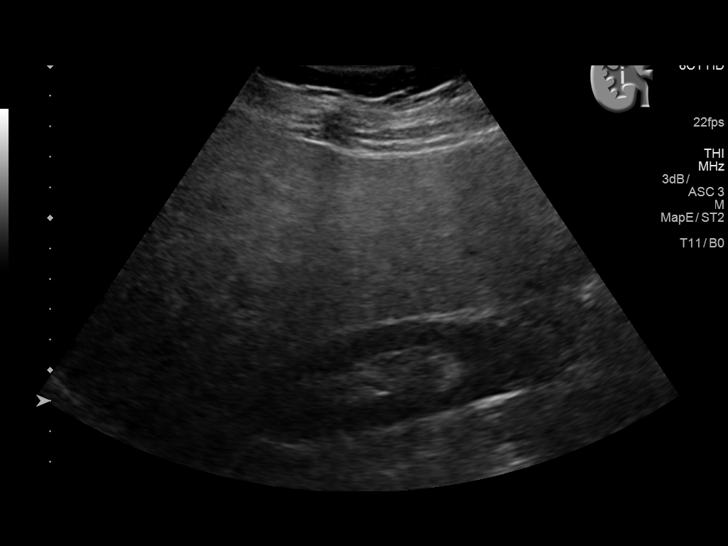
[im 3/35]
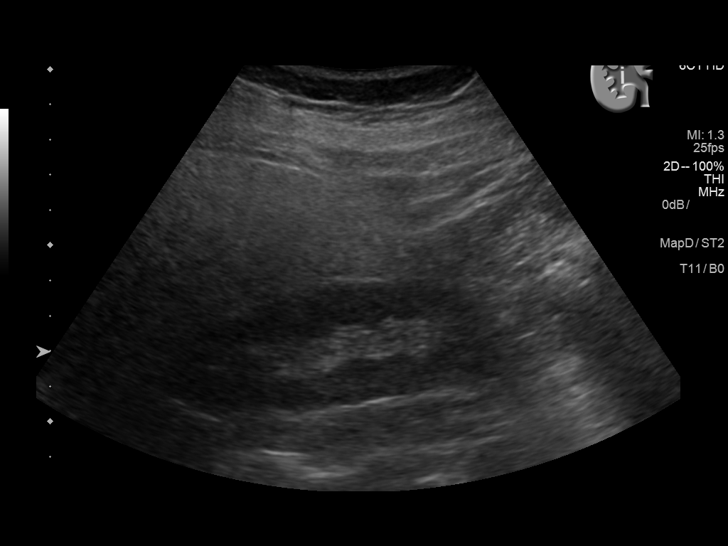
[im 6/35]
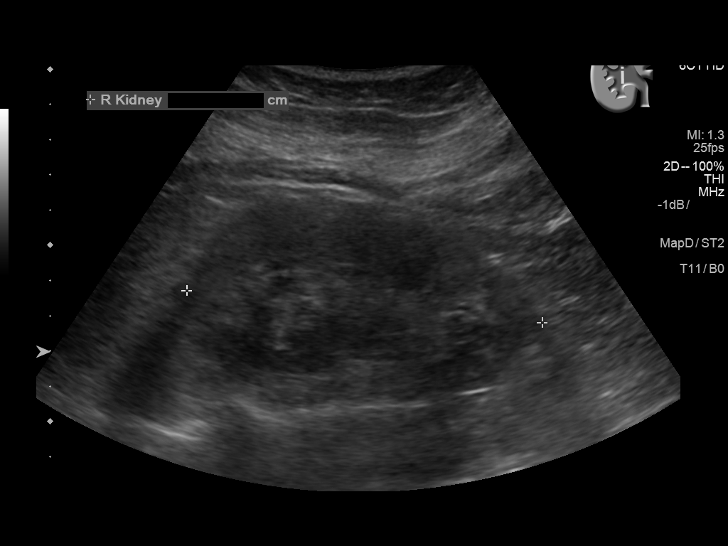
[im 9/35]
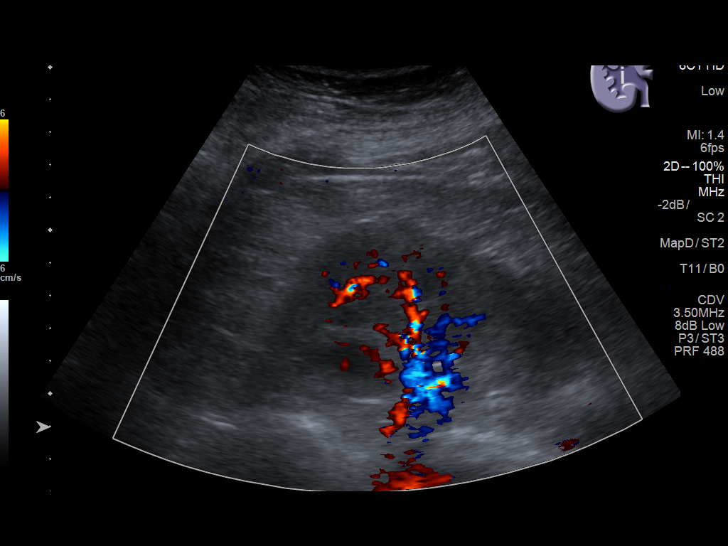
[im 12/35]
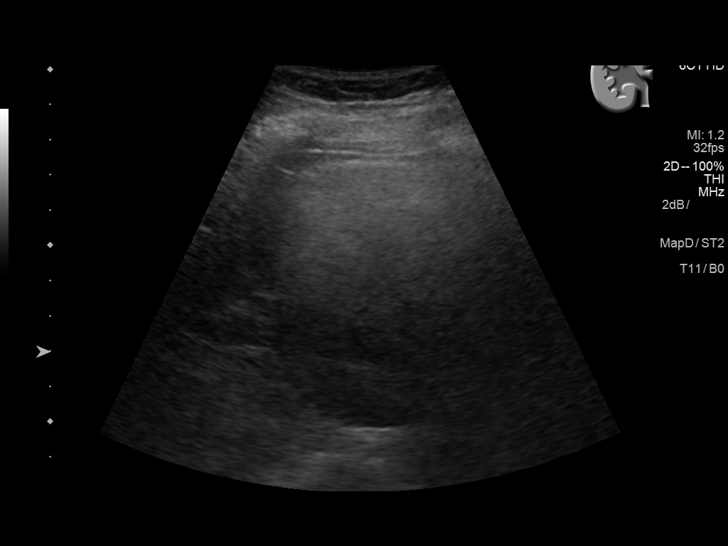
[im 13/35]
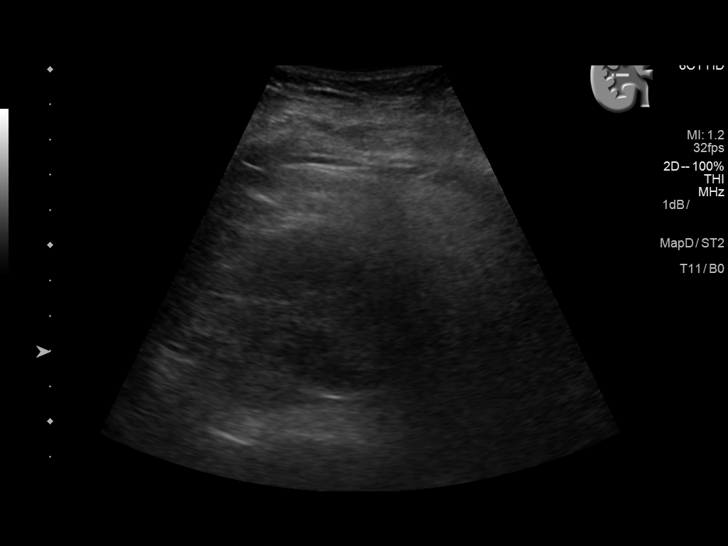
[im 16/35]
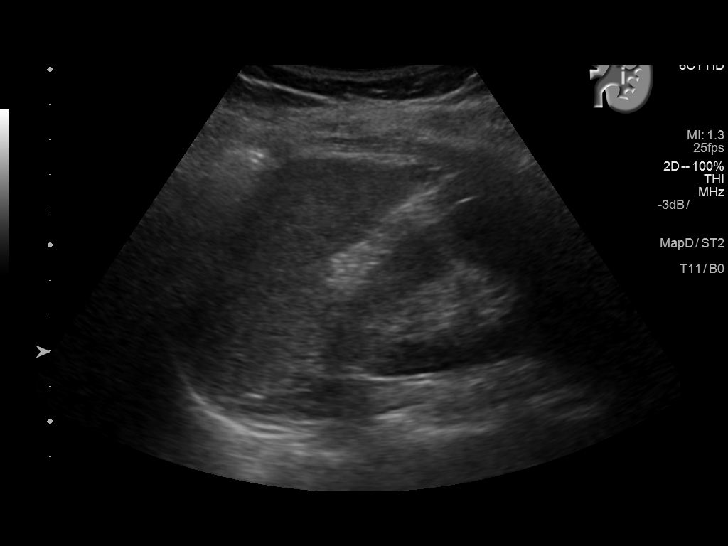
[im 19/35]
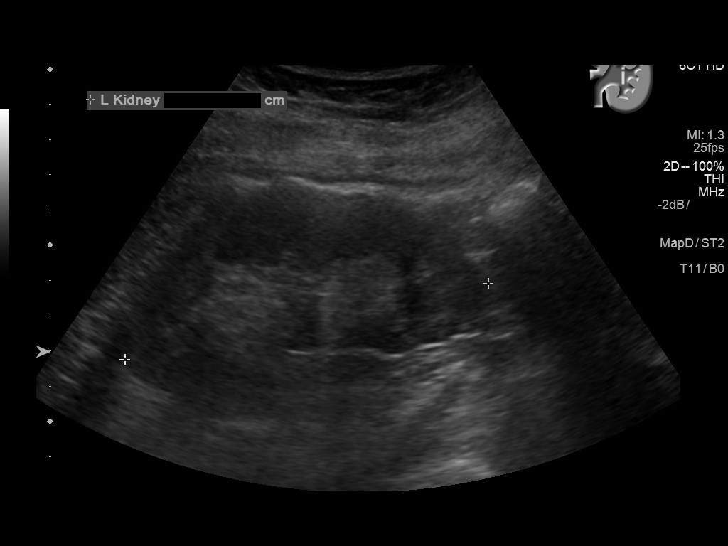
[im 22/35]
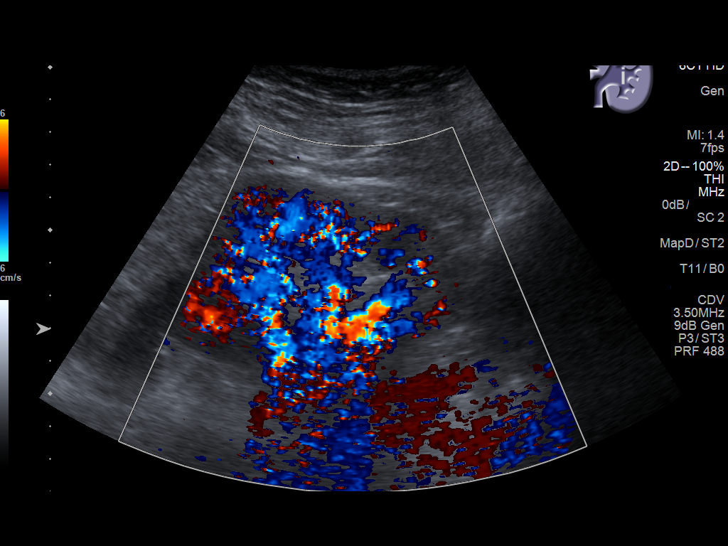
[im 23/35]
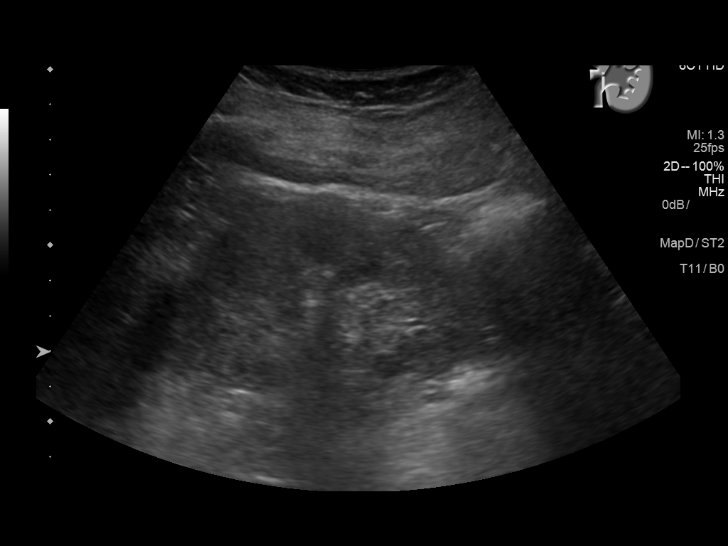
[im 26/35]
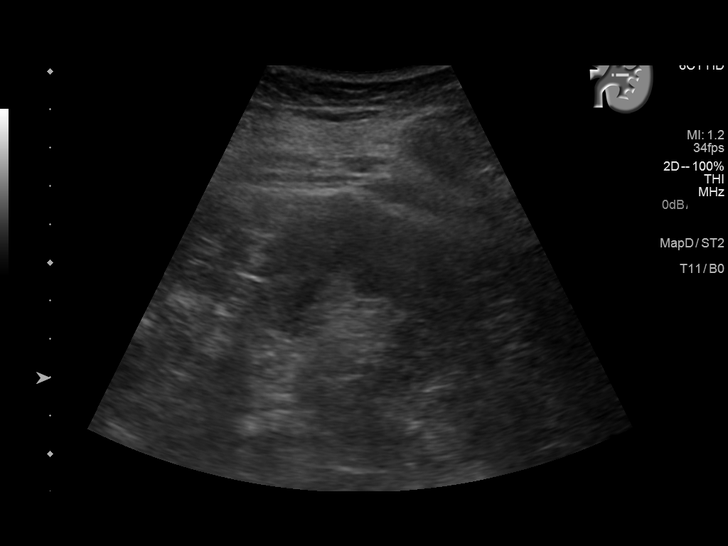
[im 29/35]
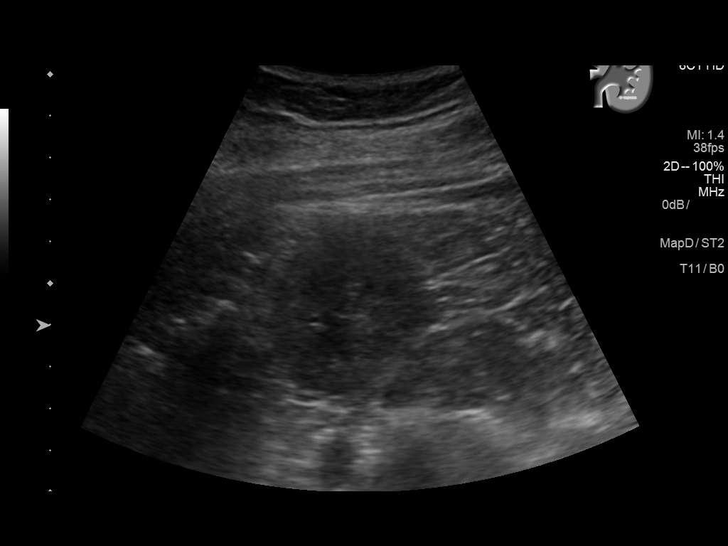
[im 32/35]
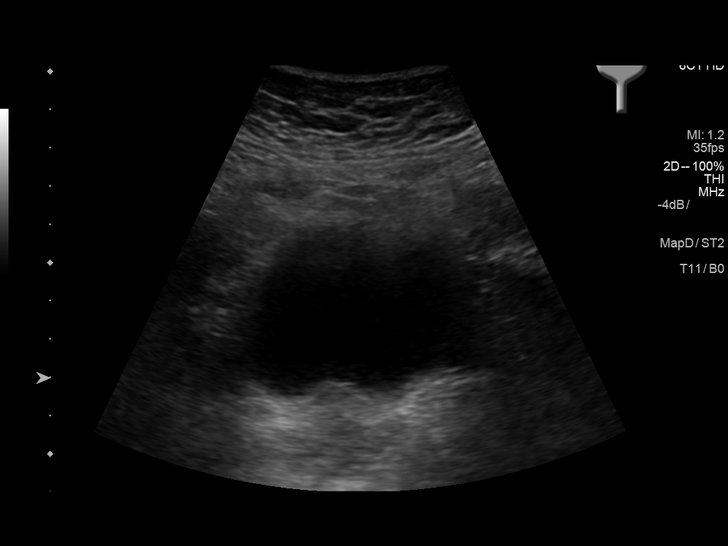
[im 35/35]
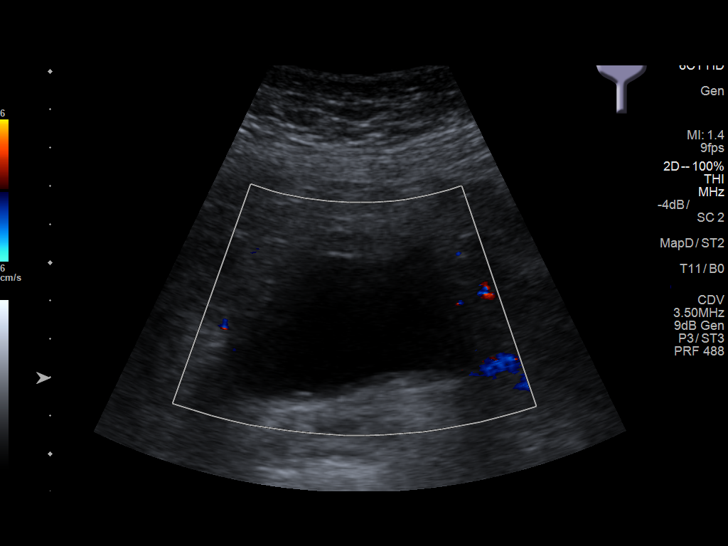

[14 of 25 positions shown; findings below may reference images not displayed]

FINDINGS: Right Kidney:

Length: 10.1 cm. Echogenicity and renal cortical thickness are
within normal limits. No mass, perinephric fluid, or hydronephrosis
visualized. There is no sonographically demonstrable calculus or
ureterectasis.

Left Kidney:

Length: 10.5 cm. Echogenicity and renal cortical thickness are
within normal limits. No mass, perinephric fluid, or hydronephrosis
visualized. There is no sonographically demonstrable calculus or
ureterectasis.

Bladder:

Appears normal for degree of bladder distention.

Liver is noted to be diffusely echogenic.
IMPRESSION: Normal appearing kidneys bilaterally.

Echogenic liver, most likely due to hepatic steatosis. While no
focal liver lesions are identified, it must be cautioned that the
sensitivity of ultrasound for detection of focal liver lesions is
diminished in this circumstance.

## 2019-01-16 ENCOUNTER — Other Ambulatory Visit: Payer: Self-pay | Admitting: Family Medicine

## 2019-01-16 DIAGNOSIS — Z1231 Encounter for screening mammogram for malignant neoplasm of breast: Secondary | ICD-10-CM

## 2019-02-27 ENCOUNTER — Other Ambulatory Visit: Payer: Self-pay

## 2019-02-27 ENCOUNTER — Ambulatory Visit
Admission: RE | Admit: 2019-02-27 | Discharge: 2019-02-27 | Disposition: A | Discharge status: Home / Self Care | Payer: Medicare Other | Source: Ambulatory Visit | Attending: Family Medicine | Admitting: Family Medicine

## 2019-02-27 ENCOUNTER — Encounter (INDEPENDENT_AMBULATORY_CARE_PROVIDER_SITE_OTHER): Payer: Self-pay

## 2019-02-27 DIAGNOSIS — Z1231 Encounter for screening mammogram for malignant neoplasm of breast: Secondary | ICD-10-CM | POA: Diagnosis present

## 2019-04-05 DIAGNOSIS — K625 Hemorrhage of anus and rectum: Secondary | ICD-10-CM | POA: Insufficient documentation

## 2020-01-22 ENCOUNTER — Other Ambulatory Visit: Payer: Self-pay | Admitting: Family Medicine

## 2020-01-22 DIAGNOSIS — Z1231 Encounter for screening mammogram for malignant neoplasm of breast: Secondary | ICD-10-CM

## 2020-02-28 ENCOUNTER — Ambulatory Visit
Admission: RE | Admit: 2020-02-28 | Discharge: 2020-02-28 | Disposition: A | Discharge status: Home / Self Care | Payer: Medicare Other | Source: Ambulatory Visit | Attending: Family Medicine | Admitting: Family Medicine

## 2020-02-28 ENCOUNTER — Encounter (INDEPENDENT_AMBULATORY_CARE_PROVIDER_SITE_OTHER): Payer: Self-pay

## 2020-02-28 ENCOUNTER — Other Ambulatory Visit: Payer: Self-pay

## 2020-02-28 DIAGNOSIS — Z1231 Encounter for screening mammogram for malignant neoplasm of breast: Secondary | ICD-10-CM | POA: Diagnosis present

## 2021-01-20 ENCOUNTER — Other Ambulatory Visit: Payer: Self-pay | Admitting: Family Medicine

## 2021-01-20 DIAGNOSIS — Z1231 Encounter for screening mammogram for malignant neoplasm of breast: Secondary | ICD-10-CM

## 2021-03-03 ENCOUNTER — Ambulatory Visit
Admission: RE | Admit: 2021-03-03 | Discharge: 2021-03-03 | Disposition: A | Discharge status: Home / Self Care | Payer: Medicare Other | Source: Ambulatory Visit | Attending: Family Medicine | Admitting: Family Medicine

## 2021-03-03 ENCOUNTER — Other Ambulatory Visit: Payer: Self-pay

## 2021-03-03 DIAGNOSIS — Z1231 Encounter for screening mammogram for malignant neoplasm of breast: Secondary | ICD-10-CM | POA: Diagnosis not present

## 2021-03-31 ENCOUNTER — Other Ambulatory Visit: Payer: Self-pay

## 2021-03-31 ENCOUNTER — Ambulatory Visit (LOCAL_COMMUNITY_HEALTH_CENTER): Payer: Medicare Other

## 2021-03-31 DIAGNOSIS — Z23 Encounter for immunization: Secondary | ICD-10-CM

## 2021-03-31 NOTE — Progress Notes (Signed)
In Nurse Clinic for Hep B vaccine. No record of any previous Hep B vaccines. Heplisav B given and tolerated well. Updated [facility] copy given and recommended schedule explained. Josie Saunders, RN

## 2021-05-05 ENCOUNTER — Other Ambulatory Visit: Payer: Self-pay

## 2021-05-05 ENCOUNTER — Ambulatory Visit (LOCAL_COMMUNITY_HEALTH_CENTER): Payer: Medicare Other

## 2021-05-05 DIAGNOSIS — Z23 Encounter for immunization: Secondary | ICD-10-CM | POA: Diagnosis not present

## 2021-05-05 NOTE — Progress Notes (Signed)
In Nurse Clinic for Heplisav B #2 which was given. Tolerated well. Updated [facility] copy given and explained. Josie Saunders, RN

## 2021-05-24 ENCOUNTER — Ambulatory Visit
Admission: EM | Admit: 2021-05-24 | Discharge: 2021-05-24 | Disposition: A | Discharge status: Home / Self Care | Payer: Medicare Other | Attending: Emergency Medicine | Admitting: Emergency Medicine

## 2021-05-24 ENCOUNTER — Other Ambulatory Visit: Payer: Self-pay

## 2021-05-24 ENCOUNTER — Ambulatory Visit (INDEPENDENT_AMBULATORY_CARE_PROVIDER_SITE_OTHER): Payer: Medicare Other

## 2021-05-24 DIAGNOSIS — R109 Unspecified abdominal pain: Secondary | ICD-10-CM | POA: Insufficient documentation

## 2021-05-24 DIAGNOSIS — R1084 Generalized abdominal pain: Secondary | ICD-10-CM

## 2021-05-24 DIAGNOSIS — N179 Acute kidney failure, unspecified: Secondary | ICD-10-CM | POA: Diagnosis not present

## 2021-05-24 LAB — URINALYSIS, COMPLETE (UACMP) WITH MICROSCOPIC
Bilirubin Urine: NEGATIVE
Glucose, UA: NEGATIVE mg/dL
Hgb urine dipstick: NEGATIVE
Leukocytes,Ua: NEGATIVE
Nitrite: NEGATIVE
Protein, ur: 100 mg/dL — AB
Specific Gravity, Urine: >1.03 — ABNORMAL HIGH (ref 1.005–1.030)
pH: 5 (ref 5.0–8.0)

## 2021-05-24 LAB — CBC WITH DIFFERENTIAL/PLATELET
Abs Immature Granulocytes: 0.06 10*3/uL (ref 0.00–0.07)
Basophils Absolute: 0.1 10*3/uL (ref 0.0–0.1)
Basophils Relative: 1 %
Eosinophils Absolute: 0 10*3/uL (ref 0.0–0.5)
Eosinophils Relative: 0 %
HCT: 41.8 % (ref 36.0–46.0)
Hemoglobin: 14 g/dL (ref 12.0–15.0)
Immature Granulocytes: 1 %
Lymphocytes Relative: 16 %
Lymphs Abs: 1.8 10*3/uL (ref 0.7–4.0)
MCH: 29.5 pg (ref 26.0–34.0)
MCHC: 33.5 g/dL (ref 30.0–36.0)
MCV: 88 fL (ref 80.0–100.0)
Monocytes Absolute: 0.7 10*3/uL (ref 0.1–1.0)
Monocytes Relative: 6 %
Neutro Abs: 8.7 10*3/uL — ABNORMAL HIGH (ref 1.7–7.7)
Neutrophils Relative %: 76 %
Platelets: 287 10*3/uL (ref 150–400)
RBC: 4.75 MIL/uL (ref 3.87–5.11)
RDW: 12.1 % (ref 11.5–15.5)
WBC: 11.3 10*3/uL — ABNORMAL HIGH (ref 4.0–10.5)
nRBC: 0 % (ref 0.0–0.2)

## 2021-05-24 LAB — COMPREHENSIVE METABOLIC PANEL
ALT: 38 U/L (ref 0–44)
AST: 49 U/L — ABNORMAL HIGH (ref 15–41)
Albumin: 4.5 g/dL (ref 3.5–5.0)
Alkaline Phosphatase: 55 U/L (ref 38–126)
Anion gap: 13 (ref 5–15)
BUN: 28 mg/dL — ABNORMAL HIGH (ref 8–23)
CO2: 30 mmol/L (ref 22–32)
Calcium: 10.2 mg/dL (ref 8.9–10.3)
Chloride: 97 mmol/L — ABNORMAL LOW (ref 98–111)
Creatinine, Ser: 1.64 mg/dL — ABNORMAL HIGH (ref 0.44–1.00)
GFR, Estimated: 33 mL/min — ABNORMAL LOW (ref >60)
Glucose, Bld: 126 mg/dL — ABNORMAL HIGH (ref 70–99)
Potassium: 3.9 mmol/L (ref 3.5–5.1)
Sodium: 140 mmol/L (ref 135–145)
Total Bilirubin: 0.8 mg/dL (ref 0.3–1.2)
Total Protein: 8.5 g/dL — ABNORMAL HIGH (ref 6.5–8.1)

## 2021-05-24 NOTE — ED Provider Notes (Signed)
[facility]    CSN: [id number] Arrival date & time: 05/24/21  1012      History   Chief Complaint Chief Complaint  Patient presents with   Nausea    HPI Tracy Curry is a 70 y.o. female.   HPI  70 year old female here for evaluation of nausea and flank pain.  Patient reports that she has been experiencing nausea with right-sided mid flank pain for the last 2 days.  She reports that the pain is sharp in nature and rates it an 8/10.  This is associated with decreased appetite and dry heaves but no vomiting or diarrhea.  Patient denies any fever.  She also denies any painful urination or urinary urgency or frequency.  No blood in her urine.  She does not have a history of renal stones.  Patient does have her appendix but not her gallbladder.  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   Past Surgical History:  Procedure Laterality Date   ABDOMINAL HYSTERECTOMY     CARPAL TUNNEL RELEASE     CHOLECYSTECTOMY     COLONOSCOPY     colostpmy     ESOPHAGOGASTRODUODENOSCOPY     ESOPHAGOGASTRODUODENOSCOPY (EGD) WITH PROPOFOL N/A 12/15/2016   Procedure: ESOPHAGOGASTRODUODENOSCOPY (EGD) WITH PROPOFOL;  Surgeon: Lollie Sails, MD;  Location: [facility] ENDOSCOPY;  Service: Endoscopy;  Laterality: N/A;   MASS EXCISION Right 03/10/2018   Procedure: EXCISION MASS OF RIGHT SEPTAL LESION;  Surgeon:  Margaretha Sheffield, MD;  Location: [facility];  Service: ENT;  Laterality: Right;  sleep apnea    OB History   No obstetric history on file.      Home Medications    Prior to Admission medications   Medication Sig Start Date End Date Taking? Authorizing Provider  allopurinol (ZYLOPRIM) 300 MG tablet Take 300 mg by mouth daily.   Yes [provider]  amLODipine (NORVASC) 10 MG tablet Take 10 mg by mouth daily.   Yes [provider]  calcium carbonate (OSCAL) 1500 (600 Ca) MG TABS tablet Take by mouth 2 (two) times daily with a meal.   Yes [provider]  Cholecalciferol (VITAMIN D3 PO) Take 125 mcg by mouth daily.   Yes [provider]  cloNIDine (CATAPRES - DOSED IN MG/24 HR) 0.2 mg/24hr patch Place 0.2 mg onto the skin once a week.   Yes [provider]  cloNIDine (CATAPRES) 0.2 MG tablet Take 0.2 mg by mouth 2 (two) times daily.   Yes [provider]  ketotifen (ZADITOR) 0.025 % ophthalmic solution 1 drop 2 (two) times daily.   Yes [provider]  magnesium oxide (MAG-OX) 400 MG tablet Take  400 mg by mouth daily.   Yes [provider]  metoprolol succinate (TOPROL-XL) 25 MG 24 hr tablet Take 25 mg by mouth daily.   Yes [provider]  Multiple Vitamin (MULTIVITAMIN) tablet Take 1 tablet by mouth daily.   Yes [provider]  OMEGA-3 FATTY ACIDS-VITAMIN E PO Take 1,200 mg by mouth daily.   Yes [provider]  omeprazole (PRILOSEC) 20 MG capsule Take 20 mg by mouth daily.   Yes [provider]  sucralfate (CARAFATE) 1 g tablet Take 1 g by mouth 3 (three) times daily with meals.   Yes [provider]    Family History Family History  Problem Relation Age of Onset   Breast cancer Other     Social History Social History   Tobacco Use   Smoking status: Never   Smokeless tobacco: Never  Vaping Use   Vaping Use: Never used  Substance Use Topics   Alcohol  use: No    Alcohol/week: 0.0 standard drinks   Drug use: No     Allergies   Gabapentin, Oxycodone-acetaminophen, Ace inhibitors, Macrolides and ketolides, Pantoprazole, Penicillins, Petrolatum-zinc oxide, Propylene glycol, Sulfa antibiotics, Tenex [guanfacine hcl], Tetracycline, Adhesive [tape], Amlactin [ammonium lactate], Bacitracin, Benzyl alcohol, Cetyl alcohol, Erythromycin, Lanolin, Neomycin, Petrolatum, Theraseal [simethicone], and Ureacin [urea]   Review of Systems Review of Systems  Constitutional:  Positive for appetite change. Negative for activity change and fever.  Gastrointestinal:  Positive for abdominal pain and nausea. Negative for blood in stool, constipation, diarrhea and vomiting.  Genitourinary:  Positive for flank pain. Negative for dysuria, frequency, hematuria and urgency.  Musculoskeletal:  Positive for back pain.  Skin:  Negative for rash.  Hematological: Negative.   Psychiatric/Behavioral: Negative.      Physical Exam Triage Vital Signs ED Triage Vitals  Enc Vitals Group     BP 05/24/21 1058 (!) 190/110     Pulse Rate 05/24/21 1058 70     Resp 05/24/21 1058 18     Temp 05/24/21 1058 98.6 F (37 C)     Temp Source 05/24/21 1058 Oral     SpO2 05/24/21 1058 99 %     Weight 05/24/21 1057 160 lb (72.6 kg)     Height 05/24/21 1057 5\' 2"  (1.575 m)     Head Circumference --      Peak Flow --      Pain Score 05/24/21 1056 8     Pain Loc --      Pain Edu? --      Excl. in [place]? --    No data found.  Updated Vital Signs BP (!) 190/110 (BP Location: Left Arm) Comment: Pt states she has hig BP at drs.  Pulse 70   Temp 98.6 F (37 C) (Oral)   Resp 18   Ht 5\' 2"  (1.575 m)   Wt 160 lb (72.6 kg)   SpO2 99%   BMI 29.26 kg/m   Visual Acuity Right Eye Distance:   Left Eye Distance:   Bilateral Distance:    Right Eye Near:   Left Eye Near:    Bilateral Near:     Physical Exam Vitals and nursing note reviewed.  Constitutional:      General: She is  in acute distress.     Appearance: Normal appearance.  HENT:     Head: Normocephalic and atraumatic.  Cardiovascular:     Rate and Rhythm: Normal rate and regular rhythm.     Pulses: Normal pulses.  Heart sounds: Normal heart sounds. No murmur heard.   No gallop.  Pulmonary:     Effort: Pulmonary effort is normal.     Breath sounds: Normal breath sounds. No wheezing, rhonchi or rales.  Abdominal:     General: Bowel sounds are normal.     Palpations: Abdomen is soft.     Tenderness: There is abdominal tenderness. There is right CVA tenderness and left CVA tenderness. There is no guarding or rebound.  Musculoskeletal:        General: Tenderness present. No swelling.  Skin:    General: Skin is warm and dry.     Capillary Refill: Capillary refill takes less than 2 seconds.     Findings: No erythema or rash.  Neurological:     General: No focal deficit present.     Mental Status: She is alert and oriented to person, place, and time.  Psychiatric:        Mood and Affect: Mood normal.        Behavior: Behavior normal.        Thought Content: Thought content normal.        Judgment: Judgment normal.     UC Treatments / Results  Labs (all labs ordered are listed, but only abnormal results are displayed) Labs Reviewed  CBC WITH DIFFERENTIAL/PLATELET - Abnormal; Notable for the following components:      Result Value   WBC 11.3 (*)    Neutro Abs 8.7 (*)    All other components within normal limits  COMPREHENSIVE METABOLIC PANEL - Abnormal; Notable for the following components:   Chloride 97 (*)    Glucose, Bld 126 (*)    BUN 28 (*)    Creatinine, Ser 1.64 (*)    Total Protein 8.5 (*)    AST 49 (*)    GFR, Estimated 33 (*)    All other components within normal limits  URINALYSIS, COMPLETE (UACMP) WITH MICROSCOPIC - Abnormal; Notable for the following components:   APPearance HAZY (*)    Specific Gravity, Urine >1.030 (*)    Ketones, ur TRACE (*)    Protein, ur 100 (*)     Bacteria, UA FEW (*)    All other components within normal limits    EKG   Radiology DG Abd 2 Views  Result Date: 05/24/2021 CLINICAL DATA:  Abdominal pain for 2 days without relief. EXAM: ABDOMEN - 2 VIEW COMPARISON:  None. FINDINGS: The bowel gas pattern is normal. There is no evidence of free air. No radio-opaque calculi or other significant radiographic abnormality is seen. IMPRESSION: Negative. Electronically Signed   By: Dorise Bullion III M.D.   On: 05/24/2021 13:34    Procedures Procedures (including critical care time)  Medications Ordered in UC Medications - No data to display  Initial Impression / Assessment and Plan / UC Course  I have reviewed the triage vital signs and the nursing notes.  Pertinent labs & imaging results that were available during my care of the patient were reviewed by me and considered in my medical decision making (see chart for details).  Patient is a pleasant 70 year old female who is here for evaluation of right-sided flank pain and nausea that has been present for the past 2 days and is not associated with urinary symptoms or fever.  She does appear to be in a moderate degree of discomfort.  She states that the pain in her right flank has been constant and has not moved in the past 2 days.  Patient has no history of renal stones and she denies history of diverticulitis.  Patient also denies any blood in her stool, constipation, or diarrhea.  Patient's physical exam reveals a benign cardiopulmonary exam with clear lung sounds all fields.  Patient does have tenderness with palpation of the right side of her back from mid thorax to the lumbar region.  There is no significant spasm or swelling noted on exam.  Patient's abdominal exam reveals a flat abdomen with bowel sounds all 4 quadrants.  Patient has tenderness in the right upper, left upper, and left lower quadrant.  Less over the right lower quadrant.  Etiology of patient's symptoms is unclear but it  is concerning for possible renal stone, colitis, diverticulitis, or appendicitis.  There is also concern for urinary tract infection despite patient having no symptoms.  We will check CBC, CMP, UA, and two-view of the abdomen to look for any irregular bowel gas patterns, stool burden, or renal stone.  I advised the patient that she still may want up in the emergency department for further evaluation as we do not have CT today.  CBC shows a mildly elevated white count of 11.3 and elevated neutrophil number of 8.7.  His all values are within normal limits.  CMP shows a glucose of 126, BUN of 28, and creatinine of 1.64.  AST is also mildly elevated at 49.  GFR 33. Patient had a CMP performed at [facility] on 11/09/2020 which showed a BUN of 11 and creatinine of 0.98.  GFR at that time was 59.  Urinalysis shows hazy appearance, high specific gravity at >1.030,, trace ketones, 100 protein, few bacteria otherwise unremarkable.  Patient is a mucus, hyaline casts, granular casts present.  2 view the abdomen independently reviewed and evaluated by me.  No acute abnormalities noted.  Nonspecific bowel gas pattern.  Radiology overread is pending. Radiology impression is normal bowel gas pattern, no evidence of free air, no radiopaque calculi or other significant radiographic abnormality seen.  Negative exam.  The source of the patient's pain is unclear but she does have an acute kidney injury with the near doubling of her creatinine from 0.98-1.64.  I have advised patient that she has an acute kidney injury and she needs to be evaluated in the emergency department for more dynamic imaging that we can provide here.  She is a [facility] patient and she is elected to go to [facility].  She is going to call a family member to come pick her up as she drove herself.  I have called and given report to [facility] the charge nurse in the emergency department at [facility].   Final Clinical Impressions(s) / UC Diagnoses    Final diagnoses:  Abdominal pain  Acute kidney injury ([place])  Right flank pain     Discharge Instructions      Please go to the emergency department at [facility] for evaluation of your flank pain and for possible identification of the cause of your kidney injury.     ED Prescriptions   None    PDMP not reviewed this encounter.   Margarette Canada, NP 05/24/21 1343

## 2021-05-24 NOTE — ED Triage Notes (Signed)
Pt here with C/O nausea and right mid side pain for a couple days, denies pain with urination, no fever, or other SX.

## 2021-05-24 NOTE — Discharge Instructions (Addendum)
Please go to the emergency department at [facility] for evaluation of your flank pain and for possible identification of the cause of your kidney injury.

## 2021-05-24 NOTE — ED Notes (Signed)
Patient is being discharged from the Urgent Care and sent to the [facility] Emergency Department via private vehicle  . Per Ysidro Evert Ryan,NP , patient is in need of higher level of care due to abnormal labs. Patient is aware and verbalizes understanding of plan of care.  Vitals:   05/24/21 1058  BP: (!) 190/110  Pulse: 70  Resp: 18  Temp: 98.6 F (37 C)  SpO2: 99%

## 2021-11-23 ENCOUNTER — Ambulatory Visit (INDEPENDENT_AMBULATORY_CARE_PROVIDER_SITE_OTHER): Payer: Medicare Other

## 2021-11-23 ENCOUNTER — Ambulatory Visit
Admission: EM | Admit: 2021-11-23 | Discharge: 2021-11-23 | Disposition: A | Discharge status: Home / Self Care | Payer: Medicare Other | Attending: Emergency Medicine | Admitting: Emergency Medicine

## 2021-11-23 ENCOUNTER — Other Ambulatory Visit: Payer: Self-pay

## 2021-11-23 ENCOUNTER — Encounter: Payer: Self-pay | Admitting: Emergency Medicine

## 2021-11-23 DIAGNOSIS — M25562 Pain in left knee: Secondary | ICD-10-CM

## 2021-11-23 DIAGNOSIS — M79605 Pain in left leg: Secondary | ICD-10-CM | POA: Diagnosis not present

## 2021-11-23 MED ORDER — PREDNISONE 10 MG (21) PO TBPK: ORAL_TABLET | ORAL | 0 refills | Status: DC

## 2021-11-23 NOTE — ED Provider Notes (Signed)
[facility]    CSN: [id number] Arrival date & time: 11/23/21  9323      History   Chief Complaint Chief Complaint  Patient presents with   Leg Pain   Knee Pain    HPI Tracy Curry is a 71 y.o. female.   HPI  71 year old female here for evaluation of left knee pain.  Patient reports that she was awoken briskly in the night at 1:30 AM with pain in her left knee and she states that the pain and swelling go down into her left lower leg and foot.  She describes numbness and tingling in her left foot like her foot is falling asleep.  Patient denies any chest pain, shortness breath, or fever.  Patient does have a history of gout but her symptoms typically show up in her feet.  She is on allopurinol and has not had a flare in years.  She denies any falls or injury.  She states that the areas are not red.  She has no history of blood clots and she is not a smoker.  She does have a history of bilateral leg edema, hypertension, gout, and chronic kidney disease.  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   Past Surgical History:  Procedure Laterality Date   ABDOMINAL HYSTERECTOMY     CARPAL TUNNEL RELEASE     CHOLECYSTECTOMY     COLONOSCOPY     colostpmy     ESOPHAGOGASTRODUODENOSCOPY     ESOPHAGOGASTRODUODENOSCOPY (EGD) WITH PROPOFOL N/A 12/15/2016   Procedure: ESOPHAGOGASTRODUODENOSCOPY  (EGD) WITH PROPOFOL;  Surgeon: Lollie Sails, MD;  [facility] END[facility]COPY;  Service: Endoscopy;  Laterality: N/A;   MASS EXCISION Right 03/10/2018   Procedure: EXCISION MASS OF RIGHT SEPTAL LESION;  Surgeon: Margaretha Sheffield, MD;  Location: [facility];  Service: ENT;  Laterality: Right;  sleep apnea    OB History   No obstetric history on file.      Home Medications    Prior to Admission medications   Medication Sig Start Date End Date Taking? Authorizing Provider  predniSONE (STERAPRED UNI-PAK 21 TAB) 10 MG (21) TBPK tablet Take 6 tablets on day 1, 5 tablets day 2, 4 tablets day 3, 3 tablets day 4, 2 tablets day 5, 1 tablet day 6 11/23/21  Yes Margarette Canada, NP  allopurinol (ZYLOPRIM) 300 MG tablet Take 300 mg by mouth daily.    [provider]  amLODipine (NORVASC) 10 MG tablet Take 10 mg by mouth daily.    [provider]  calcium carbonate (OSCAL) 1500 (600 Ca) MG TABS tablet Take by mouth 2 (two) times daily with a meal.  [provider]  Cholecalciferol (VITAMIN D3 PO) Take 125 mcg by mouth daily.    [provider]  cloNIDine (CATAPRES - DOSED IN MG/24 HR) 0.2 mg/24hr patch Place 0.2 mg onto the skin once a week.    [provider]  cloNIDine (CATAPRES) 0.2 MG tablet Take 0.2 mg by mouth 2 (two) times daily.    [provider]  ketotifen (ZADITOR) 0.025 % ophthalmic solution 1 drop 2 (two) times daily.    [provider]  magnesium oxide (MAG-OX) 400 MG tablet Take 400 mg by mouth daily.    [provider]  metoprolol succinate (TOPROL-XL) 25 MG 24 hr tablet Take 25 mg by mouth daily.    [provider]  Multiple Vitamin (MULTIVITAMIN) tablet Take 1 tablet by mouth daily.    [provider]  OMEGA-3 FATTY ACIDS-VITAMIN E PO Take 1,200 mg by mouth daily.    [provider]  omeprazole (PRILOSEC) 20 MG capsule Take 20 mg by mouth daily.    [provider]  sucralfate  (CARAFATE) 1 g tablet Take 1 g by mouth 3 (three) times daily with meals.    [provider]    Family History Family History  Problem Relation Age of Onset   Breast cancer Other     Social History Social History   Tobacco Use   Smoking status: Never   Smokeless tobacco: Never  Vaping Use   Vaping Use: Never used  Substance Use Topics   Alcohol use: No    Alcohol/week: 0.0 standard drinks   Drug use: No     Allergies   Gabapentin, Oxycodone-acetaminophen, Ace inhibitors, Macrolides and ketolides, Pantoprazole, Penicillins, Petrolatum-zinc oxide, Propylene glycol, Sulfa antibiotics, Tenex [guanfacine hcl], Tetracycline, Adhesive [tape], Amlactin [ammonium lactate], Bacitracin, Benzyl alcohol, Cetyl alcohol, Erythromycin, Lanolin, Neomycin, Petrolatum, Theraseal [simethicone], and Ureacin [urea]   Review of Systems Review of Systems  Constitutional:  Negative for fever.  Respiratory:  Negative for cough and shortness of breath.   Cardiovascular:  Negative for chest pain.  Musculoskeletal:  Positive for arthralgias, joint swelling and myalgias.  Skin:  Negative for color change.  Neurological:  Positive for numbness. Negative for weakness.  Hematological: Negative.   Psychiatric/Behavioral: Negative.      Physical Exam Triage Vital Signs ED Triage Vitals  Enc Vitals Group     BP 11/23/21 0918 (!) 154/53     Pulse Rate 11/23/21 0918 (!) 52     Resp 11/23/21 0918 14     Temp 11/23/21 0918 97.9 F (36.6 C)     Temp Source 11/23/21 0918 Oral     SpO2 11/23/21 0918 100 %     Weight 11/23/21 0916 122 lb (55.3 kg)     Height 11/23/21 0916 '5\' 2"'$  (1.575 m)     Head Circumference --      Peak Flow --      Pain Score 11/23/21 0916 6     Pain Loc --      Pain Edu? --      Excl. in [place]? --    No data found.  Updated Vital Signs BP (!) 154/53 (BP Location: Left Arm)   Pulse (!) 52   Temp 97.9 F (36.6 C) (Oral)   Resp 14   Ht '5\' 2"'$  (1.575 m)   Wt 122 lb  (55.3 kg)   SpO2 100%   BMI 22.31 kg/m   Visual Acuity Right Eye Distance:   Left Eye Distance:   Bilateral  Distance:    Right Eye Near:   Left Eye Near:    Bilateral Near:     Physical Exam Vitals and nursing note reviewed.  Constitutional:      Appearance: Normal appearance. She is not ill-appearing.  HENT:     Head: Normocephalic and atraumatic.  Cardiovascular:     Rate and Rhythm: Normal rate and regular rhythm.     Pulses: Normal pulses.     Heart sounds: Normal heart sounds. No murmur heard.   No friction rub. No gallop.  Pulmonary:     Effort: Pulmonary effort is normal.     Breath sounds: Normal breath sounds. No wheezing, rhonchi or rales.  Musculoskeletal:        General: Swelling and tenderness present. No deformity or signs of injury. Normal range of motion.  Skin:    General: Skin is warm and dry.     Capillary Refill: Capillary refill takes less than 2 seconds.     Findings: No bruising or erythema.  Neurological:     General: No focal deficit present.     Mental Status: She is alert and oriented to person, place, and time.     Motor: No weakness.  Psychiatric:        Mood and Affect: Mood normal.        Behavior: Behavior normal.        Thought Content: Thought content normal.        Judgment: Judgment normal.     UC Treatments / Results  Labs (all labs ordered are listed, but only abnormal results are displayed) Labs Reviewed - No data to display  EKG   Radiology DG Knee Complete 4 Views Left  Result Date: 11/23/2021 CLINICAL DATA:  Pain in popliteal fossa.  No injury. EXAM: LEFT KNEE - COMPLETE 4+ VIEW COMPARISON:  None Available. FINDINGS: Vascular calcifications. Enthesopathic changes off the superior patella. No fracture, dislocation, or joint effusion identified. IMPRESSION: No acute abnormalities. Electronically Signed   By: Dorise Bullion III M.D.   On: 11/23/2021 10:46    Procedures Procedures (including critical care  time)  Medications Ordered in UC Medications - No data to display  Initial Impression / Assessment and Plan / UC Course  I have reviewed the triage vital signs and the nursing notes.  Pertinent labs & imaging results that were available during my care of the patient were reviewed by me and considered in my medical decision making (see chart for details).  Patient is a nontoxic-appearing 71 year old female here for evaluation of cute onset of left knee pain and left lower leg swelling that started at 0 130 this morning.  Patient states that she has not had any injury or recent falls.  She does have a history of gout but her symptoms typically appear in her feet and she has not had a flare in years because she is on allopurinol.  She does state that the pain increases with movement and she is unable to completely flex her left knee.  She states she had no changes in her mobility yesterday.  On exam patient's left knee is mildly swollen in the posterior medial aspect compared to the right.  There is no erythema or effusion noted of the knee.  The knee is in normal anatomic position.  DP and PT pulses in her left foot are 2+ and patient has full sensation of her toes and range of motion of her foot and ankle.  There is no tenderness with  palpation of her calf and patient has a negative Homans' sign.  Patient does report that her foot has some tenderness to palpation and hurts when assisted dorsiflexion was applied.  There is no tenderness with palpation of the patella, patellar tendon, tibial tuberosity, medial lateral joint line of the knee.  With assisted flexion patient is able to get to 90 degrees but states she is having pain in her popliteal fossa.  There is fullness to the posterior medial aspect of the left knee.  This is also present on the right knee which is also tender to touch with palpation but not at rest.  The right knee is not quite as swollen as the left.  Right lower extremity is 31.5 cm and  left lower extremity is 32.5 cm I do not feel that the patient has a blood clot and I am suspicious that she may have a Baker's cyst causing her symptoms.  I will obtain radiograph of left knee to look for any signs of bony derangement.  We do not have ultrasound available to [place] for evaluation of possible Baker's cyst given that is the weekend.  If patient's x-ray is negative I will discharge her home with a trial of prednisone and have her follow-up with orthopedics if her symptoms do not improve for further evaluation and imaging.  Left knee x-ray independently reviewed and evaluated by me.  Impression: No evidence of fracture or dislocation.  No effusion noted.  No significant erosive changes.  Radiology overread is pending. Radiology impression states there are vascular calcifications and enthesial pathic changes at the superior patella.  No fracture, dislocation, or joint effusion noted.  I will discharge patient home on prednisone taper to help with pain and inflammation.  I am not sure that this is a gout flare given the lack of erythema or warmth to the joint.  Also no significant edema.  I am suspicious that the patient has a Baker's cyst but do not have ultrasound available for proper visualization.  I will also encourage patient to apply compression sleeve to her knee to give some added support, keep her knee elevated, and apply ice.  Symptoms do not prove she should follow-up with orthopedics.   Final Clinical Impressions(s) / UC Diagnoses   Final diagnoses:  Left leg pain     Discharge Instructions      Your x-rays did not show any bony abnormality.  As we discussed, I am suspicious that you may actually have a Baker's cyst which is causing your pain.  This may also be coming from muscle inflammation, though this is less likely considering you did not have any injury.  Take the prednisone according to the package instructions to help with pain and inflammation.  Purchase a  compression sleeve at the pharmacy and wear it during the day to provide added support.  I encourage you to keep your left knee elevated is much as possible to decrease swelling and aid in pain relief.  You may also apply ice to your knee for 20 minutes at a time 2-3 times a day.  I am can give you some exercises that she can perform at home to try and strengthen her knee and see if it improves her pain.  If your symptoms do not improve I recommend that you follow-up with orthopedics.  You can access orthopedics at [facility] through [facility], which is their orthopedic urgent care.  They have 2 locations in [place].     ED Prescriptions  Medication Sig Dispense Auth. Provider   predniSONE (STERAPRED UNI-PAK 21 TAB) 10 MG (21) TBPK tablet Take 6 tablets on day 1, 5 tablets day 2, 4 tablets day 3, 3 tablets day 4, 2 tablets day 5, 1 tablet day 6 21 tablet Margarette Canada, NP      PDMP not reviewed this encounter.   Margarette Canada, NP 11/23/21 1104

## 2021-11-23 NOTE — Discharge Instructions (Addendum)
Your x-rays did not show any bony abnormality.  As we discussed, I am suspicious that you may actually have a Baker's cyst which is causing your pain.  This may also be coming from muscle inflammation, though this is less likely considering you did not have any injury.  Take the prednisone according to the package instructions to help with pain and inflammation.  Purchase a compression sleeve at the pharmacy and wear it during the day to provide added support.  I encourage you to keep your left knee elevated is much as possible to decrease swelling and aid in pain relief.  You may also apply ice to your knee for 20 minutes at a time 2-3 times a day.  I am can give you some exercises that she can perform at home to try and strengthen her knee and see if it improves her pain.  If your symptoms do not improve I recommend that you follow-up with orthopedics.  You can access orthopedics at [facility] through [place], which is their orthopedic urgent care.  They have 2 locations in [place].

## 2021-11-23 NOTE — ED Triage Notes (Signed)
Patient states that she woke up this morning with left knee and left lower leg pain.  Patient states that she is unable to bend her left knee.  Patient denies injury or fall.

## 2022-01-26 ENCOUNTER — Other Ambulatory Visit: Payer: Self-pay | Admitting: Family Medicine

## 2022-01-26 DIAGNOSIS — Z1231 Encounter for screening mammogram for malignant neoplasm of breast: Secondary | ICD-10-CM

## 2022-03-10 ENCOUNTER — Ambulatory Visit
Admission: RE | Admit: 2022-03-10 | Discharge: 2022-03-10 | Disposition: A | Discharge status: Home / Self Care | Payer: Medicare Other | Source: Ambulatory Visit | Attending: Family Medicine | Admitting: Family Medicine

## 2022-03-10 DIAGNOSIS — Z1231 Encounter for screening mammogram for malignant neoplasm of breast: Secondary | ICD-10-CM | POA: Diagnosis not present

## 2022-07-07 ENCOUNTER — Ambulatory Visit
Admission: RE | Admit: 2022-07-07 | Discharge: 2022-07-07 | Disposition: A | Discharge status: Home / Self Care | Payer: Medicare Other | Source: Ambulatory Visit | Attending: Family Medicine | Admitting: Family Medicine

## 2022-07-07 ENCOUNTER — Other Ambulatory Visit: Payer: Self-pay | Admitting: Family Medicine

## 2022-07-07 DIAGNOSIS — R2242 Localized swelling, mass and lump, left lower limb: Secondary | ICD-10-CM | POA: Diagnosis present

## 2022-11-09 ENCOUNTER — Ambulatory Visit (INDEPENDENT_AMBULATORY_CARE_PROVIDER_SITE_OTHER): Payer: 59 | Admitting: Internal Medicine

## 2022-11-09 VITALS — BP 141/72 | HR 56 | Resp 14 | Ht 62.0 in | Wt 123.0 lb

## 2022-11-09 DIAGNOSIS — M064 Inflammatory polyarthropathy: Secondary | ICD-10-CM | POA: Insufficient documentation

## 2022-11-09 DIAGNOSIS — J309 Allergic rhinitis, unspecified: Secondary | ICD-10-CM | POA: Insufficient documentation

## 2022-11-09 DIAGNOSIS — G4733 Obstructive sleep apnea (adult) (pediatric): Secondary | ICD-10-CM | POA: Diagnosis not present

## 2022-11-09 DIAGNOSIS — J45909 Unspecified asthma, uncomplicated: Secondary | ICD-10-CM | POA: Insufficient documentation

## 2022-11-09 DIAGNOSIS — G4710 Hypersomnia, unspecified: Secondary | ICD-10-CM

## 2022-11-09 DIAGNOSIS — E119 Type 2 diabetes mellitus without complications: Secondary | ICD-10-CM | POA: Insufficient documentation

## 2022-11-09 DIAGNOSIS — I10 Essential (primary) hypertension: Secondary | ICD-10-CM

## 2022-11-09 DIAGNOSIS — Z8711 Personal history of peptic ulcer disease: Secondary | ICD-10-CM | POA: Insufficient documentation

## 2022-11-09 DIAGNOSIS — G43909 Migraine, unspecified, not intractable, without status migrainosus: Secondary | ICD-10-CM | POA: Insufficient documentation

## 2022-11-09 DIAGNOSIS — C8250 Diffuse follicle center lymphoma, unspecified site: Secondary | ICD-10-CM | POA: Insufficient documentation

## 2022-11-09 NOTE — Progress Notes (Signed)
Sleep Medicine   Office Visit  Patient Name: Tracy Curry DOB: October 23, 1950 MRN [id number]    Chief Complaint: establish care for OSA  Brief History:  Tracy Curry presents for an initial consult for sleep evaluation and to establish care.  The patient has a cpap set for APAP 5-20 but has not used it in a year. The patient has a 6 year history of sleep apnea and is currently on a CPAP. Sleep quality is poor. This is noted most nights. Prior to using a PAP, the patient's bed partner/ family reported the following symptoms: in past had gasping,  no bed partner to note symptoms at night. The patient relates the following symptoms currently: excessive daytime sleepiness and gasping. The patient goes to sleep at 9:30 pm and wakes up at 5 am. The patient *** a history of psychiatric problems. The Epworth Sleepiness Score is 22 out of 24 . The patient's STOP-BANG score is 3. The patient relates  Cardiovascular risk factors include: hypertension. The patient has a PAP@ 4-20 cmH2O, that she has not used for almost the past year.  She did still feel tired when using the PAP.   ROS  General: (-) fever, (-) chills, (-) night sweat Nose and Sinuses: (-) nasal stuffiness or itchiness, (-) postnasal drip, (-) nosebleeds, (-) sinus trouble. Mouth and Throat: (-) sore throat, (-) hoarseness. Neck: (-) swollen glands, (-) enlarged thyroid, (-) neck pain. Respiratory: - cough, - shortness of breath, - wheezing. Neurologic: - numbness, - tingling. Psychiatric: - anxiety, - depression Sleep behavior: -sleep paralysis -hypnogogic hallucinations -dream enactment      -vivid dreams -cataplexy -night terrors -sleep walking   Current Medication: Outpatient Encounter Medications as of 11/09/2022  Medication Sig   allopurinol (ZYLOPRIM) 100 MG tablet Take 100 mg by mouth daily.   cloNIDine (CATAPRES) 0.3 MG tablet Take by mouth.   metoprolol succinate (TOPROL-XL) 50 MG 24 hr tablet TAKE ONE TABLET BY MOUTH DAILY AT 9  AM   amLODipine (NORVASC) 5 MG tablet Take 5 mg by mouth daily.   atorvastatin (LIPITOR) 20 MG tablet Take 20 mg by mouth daily.   calcium carbonate (OSCAL) 1500 (600 Ca) MG TABS tablet Take by mouth 2 (two) times daily with a meal.   Cholecalciferol (VITAMIN D3 PO) Take 125 mcg by mouth daily.   ketotifen (ZADITOR) 0.025 % ophthalmic solution 1 drop 2 (two) times daily.   magnesium oxide (MAG-OX) 400 MG tablet Take 400 mg by mouth daily.   Multiple Vitamin (MULTIVITAMIN) tablet Take 1 tablet by mouth daily.   OMEGA-3 FATTY ACIDS-VITAMIN E PO Take 1,200 mg by mouth daily.   omeprazole (PRILOSEC) 20 MG capsule Take 20 mg by mouth daily.   [DISCONTINUED] allopurinol (ZYLOPRIM) 300 MG tablet Take 300 mg by mouth daily.   [DISCONTINUED] amLODipine (NORVASC) 10 MG tablet Take 10 mg by mouth daily.   [DISCONTINUED] cloNIDine (CATAPRES - DOSED IN MG/24 HR) 0.2 mg/24hr patch Place 0.2 mg onto the skin once a week.   [DISCONTINUED] cloNIDine (CATAPRES) 0.2 MG tablet Take 0.2 mg by mouth 2 (two) times daily.   [DISCONTINUED] metoprolol succinate (TOPROL-XL) 25 MG 24 hr tablet Take 25 mg by mouth daily.   [DISCONTINUED] predniSONE (STERAPRED UNI-PAK 21 TAB) 10 MG (21) TBPK tablet Take 6 tablets on day 1, 5 tablets day 2, 4 tablets day 3, 3 tablets day 4, 2 tablets day 5, 1 tablet day 6   [DISCONTINUED] sucralfate (CARAFATE) 1 g tablet Take 1 g by mouth  3 (three) times daily with meals.   No facility-administered encounter medications on file as of 11/09/2022.    Surgical History: Past Surgical History:  Procedure Laterality Date   ABDOMINAL HYSTERECTOMY     CARPAL TUNNEL RELEASE     CHOLECYSTECTOMY     COLONOSCOPY     colostpmy     ESOPHAGOGASTRODUODENOSCOPY     ESOPHAGOGASTRODUODENOSCOPY (EGD) WITH PROPOFOL N/A 12/15/2016   Procedure: ESOPHAGOGASTRODUODENOSCOPY (EGD) WITH PROPOFOL;  Surgeon: Christena Deem, MD;  Location: [facility] ENDOSCOPY;  Service: Endoscopy;  Laterality: N/A;   MASS EXCISION  Right 03/10/2018   Procedure: EXCISION MASS OF RIGHT SEPTAL LESION;  Surgeon: Vernie Murders, MD;  Location: [facility];  Service: ENT;  Laterality: Right;  sleep apnea    Medical History: Past Medical History:  Diagnosis Date   Arthritis    hands   Asthma    Bilateral leg edema    Cancer (HCC)    skin ca   Chronic kidney disease    stage 3   Dysrhythmia    bradycardia   Ectropion of both eyes    GERD (gastroesophageal reflux disease)    Gout    Hyperglycemia    Hyperlipidemia    Hypertension    Lymphoma (HCC) 10/21/2017   Osteopenia    PONV (postoperative nausea and vomiting)    Sleep apnea    CPAP   Stenosis of lacrimal punctum, bilateral    Vertigo    using topical oils   VHD (valvular heart disease)    Wears dentures    full upper    Family History: Non contributory to the present illness  Social History: Social History   Socioeconomic History   Marital status: Single    Spouse name: Not on file   Number of children: Not on file   Years of education: Not on file   Highest education level: Not on file  Occupational History   Not on file  Tobacco Use   Smoking status: Never   Smokeless tobacco: Never  Vaping Use   Vaping Use: Never used  Substance and Sexual Activity   Alcohol use: No    Alcohol/week: 0.0 standard drinks of alcohol   Drug use: No   Sexual activity: Not on file  Other Topics Concern   Not on file  Social History Narrative   Not on file   Social Determinants of Health   Financial Resource Strain: Not on file  Food Insecurity: Not on file  Transportation Needs: Not on file  Physical Activity: Not on file  Stress: Not on file  Social Connections: Not on file  Intimate Partner Violence: Not on file    Vital Signs: Blood pressure (!) 141/72, pulse (!) 56, resp. rate 14, height 5\' 2"  (1.575 m), weight 123 lb (55.8 kg), SpO2 97 %. Body mass index is 22.5 kg/m.   Examination: General Appearance: The patient is  well-developed, well-nourished, and in no distress. Neck Circumference: 32 cm Skin: Gross inspection of skin unremarkable. Head: normocephalic, no gross deformities. Eyes: no gross deformities noted. ENT: ears appear grossly normal Neurologic: Alert and oriented. No involuntary movements.    STOP BANG RISK ASSESSMENT S (snore) Have you been told that you snore?     NO   T (tired) Are you often tired, fatigued, or sleepy during the day?   YES  O (obstruction) Do you stop breathing, choke, or gasp during sleep? NO   P (pressure) Do you have or are you being treated  for high blood pressure? YES   B (BMI) Is your body index greater than 35 kg/m? NO   A (age) Are you 6 years old or older? YES   N (neck) Do you have a neck circumference greater than 16 inches?   NO   G (gender) Are you a female? NO   TOTAL STOP/BANG "YES" ANSWERS 3                                                               A STOP-Bang score of 2 or less is considered low risk, and a score of 5 or more is high risk for having either moderate or severe OSA. For people who score 3 or 4, doctors may need to perform further assessment to determine how likely they are to have OSA.         EPWORTH SLEEPINESS SCALE:  Scale:  (0)= no chance of dozing; (1)= slight chance of dozing; (2)= moderate chance of dozing; (3)= high chance of dozing  Chance  Situtation    Sitting and reading: 3    Watching TV: 3    Sitting Inactive in public: 2    As a passenger in car: 3      Lying down to rest: 3    Sitting and talking: 3    Sitting quielty after lunch: 2    In a car, stopped in traffic: 3   TOTAL SCORE:   22 out of 24   CPAP COMPLIANCE DATA:  Patient has not been using her machine.   SLEEP STUDIES:  SPLIT (04/14/16) AHI 66, min SPO2 80%, recommended CPAP at 7 cmh20 HST (11/15/20) AHI 26.5, supine AHI 54.9, min SPO2 80%   LABS: No results found for this or any previous visit (from the past 2160  hour(s)).  Radiology: US Venous Img Lower Unilateral Left (DVT)  Result Date: 07/07/2022 CLINICAL DATA:  One-month history of left lower extremity swelling EXAM: LEFT LOWER EXTREMITY VENOUS DOPPLER ULTRASOUND TECHNIQUE: Gray-scale sonography with compression, as well as color and duplex ultrasound, were performed to evaluate the deep venous system(s) from the level of the common femoral vein through the popliteal and proximal calf veins. COMPARISON:  None Available. FINDINGS: VENOUS Normal compressibility of the common femoral, superficial femoral, and popliteal veins, as well as the visualized calf veins. Visualized portions of profunda femoral vein and great saphenous vein unremarkable. No filling defects to suggest DVT on grayscale or color Doppler imaging. Doppler waveforms show normal direction of venous flow, normal respiratory plasticity and response to augmentation. Limited views of the contralateral common femoral vein are unremarkable. OTHER None. Limitations: none IMPRESSION: Negative. Electronically Signed   By: Agustin Cree M.D.   On: 07/07/2022 16:38    No results found.  No results found.    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 OSA (obstructive sleep apnea) PLAN OSA:   Patient evaluation suggests high risk of sleep  disordered breathing due to history of severe OSA demonstrated on split study of 04/2016 with AHI of 66. Pt has ongoing daytime sleepiness and gasping. She needs a repeat split study to determine proper treatment as she has not tolerated her cpap well in the last year.  Patient has comorbid cardiovascular risk factors including: hypertension which could be exacerbated by pathologic sleep-disordered breathing.  Suggest: split study  to assess/treat the patient's sleep disordered breathing.   2. Hypersomnia F/u after psg.   3. Essential hypertension Hypertension Counseling:   The following hypertensive lifestyle modification were recommended and discussed:  1. Limiting alcohol intake to less than 1 oz/day of ethanol:(24 oz of beer or 8 oz of wine or 2 oz of 100-proof whiskey). 2. Take baby ASA 81 mg daily. 3. Importance of regular aerobic exercise and losing weight. 4. Reduce dietary saturated fat and cholesterol intake for overall cardiovascular health. 5. Maintaining adequate dietary potassium, calcium, and magnesium intake. 6. Regular monitoring of the blood pressure. 7. Reduce sodium intake to less than 100 mmol/day (less than 2.3 gm of sodium or less than 6 gm of sodium choride)      General Counseling: I have discussed the findings of the evaluation and examination with Britta Mccreedy.  I have also discussed any further diagnostic evaluation thatmay be needed or ordered today. Lee-Ann verbalizes understanding of the findings of todays visit. We also reviewed her medications today and discussed drug interactions and side effects including but not limited excessive drowsiness and altered mental states. We also discussed that there is always a risk not just to her but also people around her. she has been encouraged to call the office with any questions or concerns that should arise related to todays visit.  No orders of the defined types were placed in this encounter.       I have personally  obtained a history, evaluated the patient, evaluated pertinent data, formulated the assessment and plan and placed orders.   This patient was seen today by Emmaline Kluver, PA-C in collaboration with Dr. Freda Munro.   Yevonne Pax, MD [facility] Diplomate ABMS Pulmonary and Critical Care Medicine Sleep medicine

## 2022-11-09 NOTE — Progress Notes (Deleted)
Sleep Medicine   Office Visit  Patient Name: Tracy Curry DOB: 1951/06/19 MRN [id number]    Chief Complaint: ***  Brief History:  Ashay presents with a *** history of ***. This is *** in the *** position.   Sleep quality is ***. This is noted *** nights. The patient's bed partner reports  *** at night. The patient relates the following symptoms: *** are also present. The patient goes to sleep at *** and wakes up at ***.  Sleep quality is *** when outside home environment.  Patient has noted *** of her legs at night.  The patient  relates *** behavior during the night.  The patient *** a history of psychiatric problems. The Epworth Sleepiness Score is *** out of 24 .  The patient relates  Cardiovascular risk factors include: *** The patient reports ***    ROS  General: (-) fever, (-) chills, (-) night sweat Nose and Sinuses: (-) nasal stuffiness or itchiness, (-) postnasal drip, (-) nosebleeds, (-) sinus trouble. Mouth and Throat: (-) sore throat, (-) hoarseness. Neck: (-) swollen glands, (-) enlarged thyroid, (-) neck pain. Respiratory: *** cough, *** shortness of breath, *** wheezing. Neurologic: *** numbness, *** tingling. Psychiatric: *** anxiety, *** depression Sleep behavior: ***sleep paralysis ***hypnogogic hallucinations ***dream enactment      ***vivid dreams ***cataplexy ***night terrors ***sleep walking   Current Medication: Outpatient Encounter Medications as of 11/09/2022  Medication Sig   allopurinol (ZYLOPRIM) 300 MG tablet Take 300 mg by mouth daily.   amLODipine (NORVASC) 10 MG tablet Take 10 mg by mouth daily.   calcium carbonate (OSCAL) 1500 (600 Ca) MG TABS tablet Take by mouth 2 (two) times daily with a meal.   Cholecalciferol (VITAMIN D3 PO) Take 125 mcg by mouth daily.   cloNIDine (CATAPRES - DOSED IN MG/24 HR) 0.2 mg/24hr patch Place 0.2 mg onto the skin once a week.   cloNIDine (CATAPRES) 0.2 MG tablet Take 0.2 mg by mouth 2 (two) times daily.    ketotifen (ZADITOR) 0.025 % ophthalmic solution 1 drop 2 (two) times daily.   magnesium oxide (MAG-OX) 400 MG tablet Take 400 mg by mouth daily.   metoprolol succinate (TOPROL-XL) 25 MG 24 hr tablet Take 25 mg by mouth daily.   Multiple Vitamin (MULTIVITAMIN) tablet Take 1 tablet by mouth daily.   OMEGA-3 FATTY ACIDS-VITAMIN E PO Take 1,200 mg by mouth daily.   omeprazole (PRILOSEC) 20 MG capsule Take 20 mg by mouth daily.   predniSONE (STERAPRED UNI-PAK 21 TAB) 10 MG (21) TBPK tablet Take 6 tablets on day 1, 5 tablets day 2, 4 tablets day 3, 3 tablets day 4, 2 tablets day 5, 1 tablet day 6   sucralfate (CARAFATE) 1 g tablet Take 1 g by mouth 3 (three) times daily with meals.   No facility-administered encounter medications on file as of 11/09/2022.    Surgical History: Past Surgical History:  Procedure Laterality Date   ABDOMINAL HYSTERECTOMY     CARPAL TUNNEL RELEASE     CHOLECYSTECTOMY     COLONOSCOPY     colostpmy     ESOPHAGOGASTRODUODENOSCOPY     ESOPHAGOGASTRODUODENOSCOPY (EGD) WITH PROPOFOL N/A 12/15/2016   Procedure: ESOPHAGOGASTRODUODENOSCOPY (EGD) WITH PROPOFOL;  Surgeon: Christena Deem, MD;  Location: [facility] ENDOSCOPY;  Service: Endoscopy;  Laterality: N/A;   MASS EXCISION Right 03/10/2018   Procedure: EXCISION MASS OF RIGHT SEPTAL LESION;  Surgeon: Vernie Murders, MD;  Location: [facility];  Service: ENT;  Laterality: Right;  sleep apnea  Medical History: Past Medical History:  Diagnosis Date   Arthritis    hands   Asthma    Bilateral leg edema    Cancer (HCC)    skin ca   Chronic kidney disease    stage 3   Dysrhythmia    bradycardia   Ectropion of both eyes    GERD (gastroesophageal reflux disease)    Gout    Hyperglycemia    Hyperlipidemia    Hypertension    Lymphoma (HCC) 10/21/2017   Osteopenia    PONV (postoperative nausea and vomiting)    Sleep apnea    CPAP   Stenosis of lacrimal punctum, bilateral    Vertigo    using topical oils    VHD (valvular heart disease)    Wears dentures    full upper    Family History: Non contributory to the present illness  Social History: Social History   Socioeconomic History   Marital status: Single    Spouse name: Not on file   Number of children: Not on file   Years of education: Not on file   Highest education level: Not on file  Occupational History   Not on file  Tobacco Use   Smoking status: Never   Smokeless tobacco: Never  Vaping Use   Vaping Use: Never used  Substance and Sexual Activity   Alcohol use: No    Alcohol/week: 0.0 standard drinks of alcohol   Drug use: No   Sexual activity: Not on file  Other Topics Concern   Not on file  Social History Narrative   Not on file   Social Determinants of Health   Financial Resource Strain: Not on file  Food Insecurity: Not on file  Transportation Needs: Not on file  Physical Activity: Not on file  Stress: Not on file  Social Connections: Not on file  Intimate Partner Violence: Not on file    Vital Signs: There were no vitals taken for this visit. There is no height or weight on file to calculate BMI.   Examination: General Appearance: The patient is well-developed, well-nourished, and in no distress. Neck Circumference: *** Skin: Gross inspection of skin unremarkable. Head: normocephalic, no gross deformities. Eyes: no gross deformities noted. ENT: ears appear grossly normal Neurologic: Alert and oriented. No involuntary movements.    STOP BANG RISK ASSESSMENT S (snore) Have you been told that you snore?     YES/N   T (tired) Are you often tired, fatigued, or sleepy during the day?   YES/NO  O (obstruction) Do you stop breathing, choke, or gasp during sleep? YES/NO   P (pressure) Do you have or are you being treated for high blood pressure? YES/NO   B (BMI) Is your body index greater than 35 kg/m? YES/NO   A (age) Are you 30 years old or older? YES   N (neck) Do you have a neck circumference  greater than 16 inches?   YES/NO   G (gender) Are you a female? NO   TOTAL STOP/BANG "YES" ANSWERS                                                                A STOP-Bang score of 2 or less is considered low risk, and a score of 5  or more is high risk for having either moderate or severe OSA. For people who score 3 or 4, doctors may need to perform further assessment to determine how likely they are to have OSA.         EPWORTH SLEEPINESS SCALE:  Scale:  (0)= no chance of dozing; (1)= slight chance of dozing; (2)= moderate chance of dozing; (3)= high chance of dozing  Chance  Situtation    Sitting and reading: ***    Watching TV: ***    Sitting Inactive in public: ***    As a passenger in car: ***      Lying down to rest: ***    Sitting and talking: ***    Sitting quielty after lunch: ***    In a car, stopped in traffic: ***   TOTAL SCORE:   *** out of 24    SLEEP STUDIES:  ***   LABS: No results found for this or any previous visit (from the past 2160 hour(s)).  Radiology: US Venous Img Lower Unilateral Left (DVT)  Result Date: 07/07/2022 CLINICAL DATA:  One-month history of left lower extremity swelling EXAM: LEFT LOWER EXTREMITY VENOUS DOPPLER ULTRASOUND TECHNIQUE: Gray-scale sonography with compression, as well as color and duplex ultrasound, were performed to evaluate the deep venous system(s) from the level of the common femoral vein through the popliteal and proximal calf veins. COMPARISON:  None Available. FINDINGS: VENOUS Normal compressibility of the common femoral, superficial femoral, and popliteal veins, as well as the visualized calf veins. Visualized portions of profunda femoral vein and great saphenous vein unremarkable. No filling defects to suggest DVT on grayscale or color Doppler imaging. Doppler waveforms show normal direction of venous flow, normal respiratory plasticity and response to augmentation. Limited views of the contralateral common  femoral vein are unremarkable. OTHER None. Limitations: none IMPRESSION: Negative. Electronically Signed   By: Agustin Cree M.D.   On: 07/07/2022 16:38    No results found.  No results found.    Assessment and Plan: There are no problems to display for this patient.    PLAN OSA:   Patient evaluation suggests high risk of sleep disordered breathing due to *** Patient has comorbid cardiovascular risk factors including: *** which could be exacerbated by pathologic sleep-disordered breathing.  Suggest: *** to assess/treat the patient's sleep disordered breathing. The patient was also counselled on *** to optimize sleep health.  PLAN hypersomnia:  Patient evaluation suggests significant daytime hypersomnia.  The Epworth Sleepiness Score is elevated at *** out of 24. Patient *** drowsy driving. The patient *** MVA due to sleepiness.  The patient *** restless leg symptoms which exacerbate *** for *** nights per week. The patient *** periodic limb movements which exacerbate ***  for *** nights per week. Suggest: ***  Also suggest ***  PLAN insomnia:  Patient evaluation suggests *** insomnia. This is a chronic disorder. This has been a concern for *** and causes impaired daytime functioning. The patient exhibits comorbid ***  The history *** suggest the insomnia predates the use of hypnotic medications. The symptoms *** with the discontinuation of these medications. There is no obvious medical, psychiatric or pharmacologic abuse issues ot account for the insomnia.  Treatment recommendations include: *** The patient should maintain a sleep log and calculate total sleep time for 1-2 weeks. Set bed and wake times for achieve 85% sleep efficiency for one week. Once this is achieved  time in bed can be gradually increased. A pharmacologic treatment approach would include a  trial of *** for the next ***  months. During this time the patient is to maintain a sleep diary to track  progress.    ***  General Counseling: I have discussed the findings of the evaluation and examination with Britta Mccreedy.  I have also discussed any further diagnostic evaluation thatmay be needed or ordered today. Teeghan verbalizes understanding of the findings of todays visit. We also reviewed her medications today and discussed drug interactions and side effects including but not limited excessive drowsiness and altered mental states. We also discussed that there is always a risk not just to her but also people around her. she has been encouraged to call the office with any questions or concerns that should arise related to todays visit.  No orders of the defined types were placed in this encounter.       I have personally obtained a history, evaluated the patient, evaluated pertinent data, formulated the assessment and plan and placed orders.    Yevonne Pax, MD [facility] Diplomate ABMS Pulmonary and Critical Care Medicine Sleep medicine

## 2022-11-09 NOTE — Patient Instructions (Signed)

## 2023-01-11 IMAGING — CR DG KNEE COMPLETE 4+V*L*
4 series · 4 of 4 positions shown · non-contrast
Comparison: None Available.

CLINICAL DATA: Pain in popliteal fossa.  No injury.

EXAM:
LEFT KNEE - COMPLETE 4+ VIEW

[knee ap]
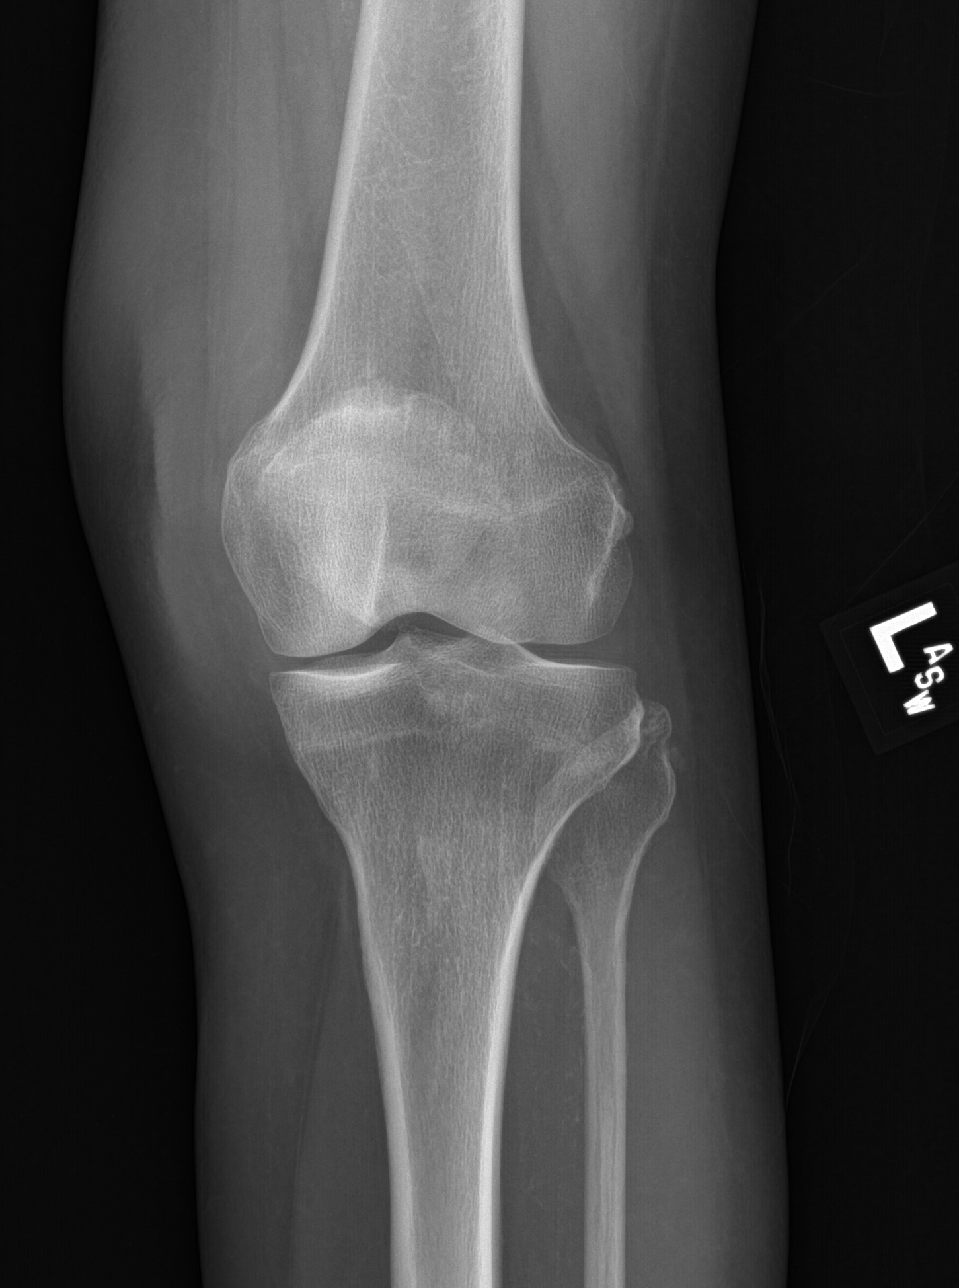

[knee lat]
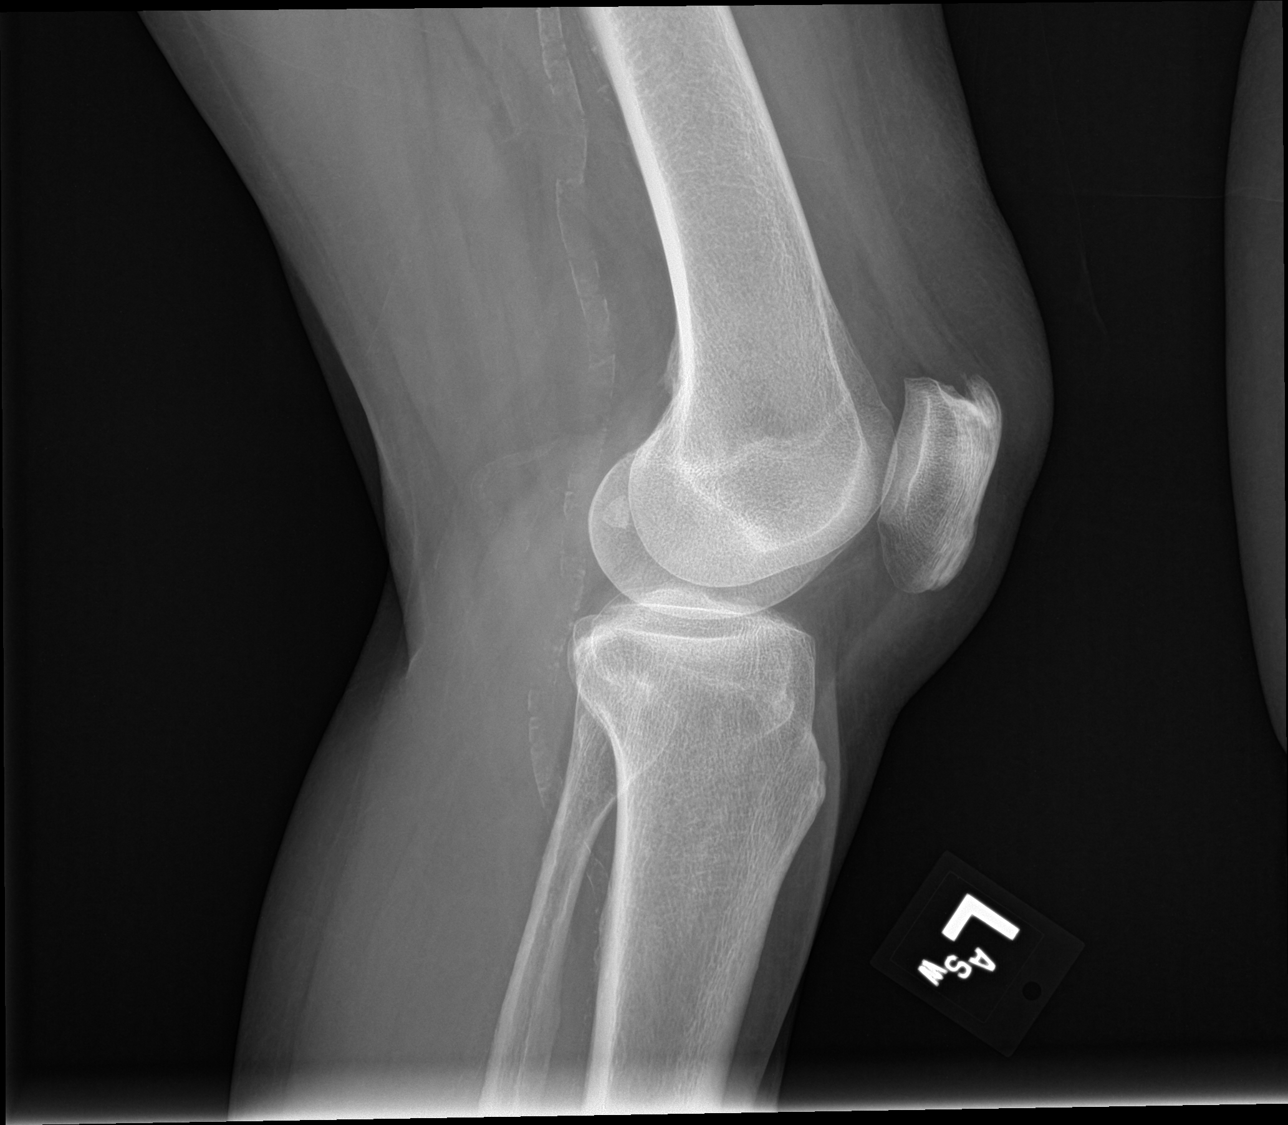

[knee obl (1 of 2)]
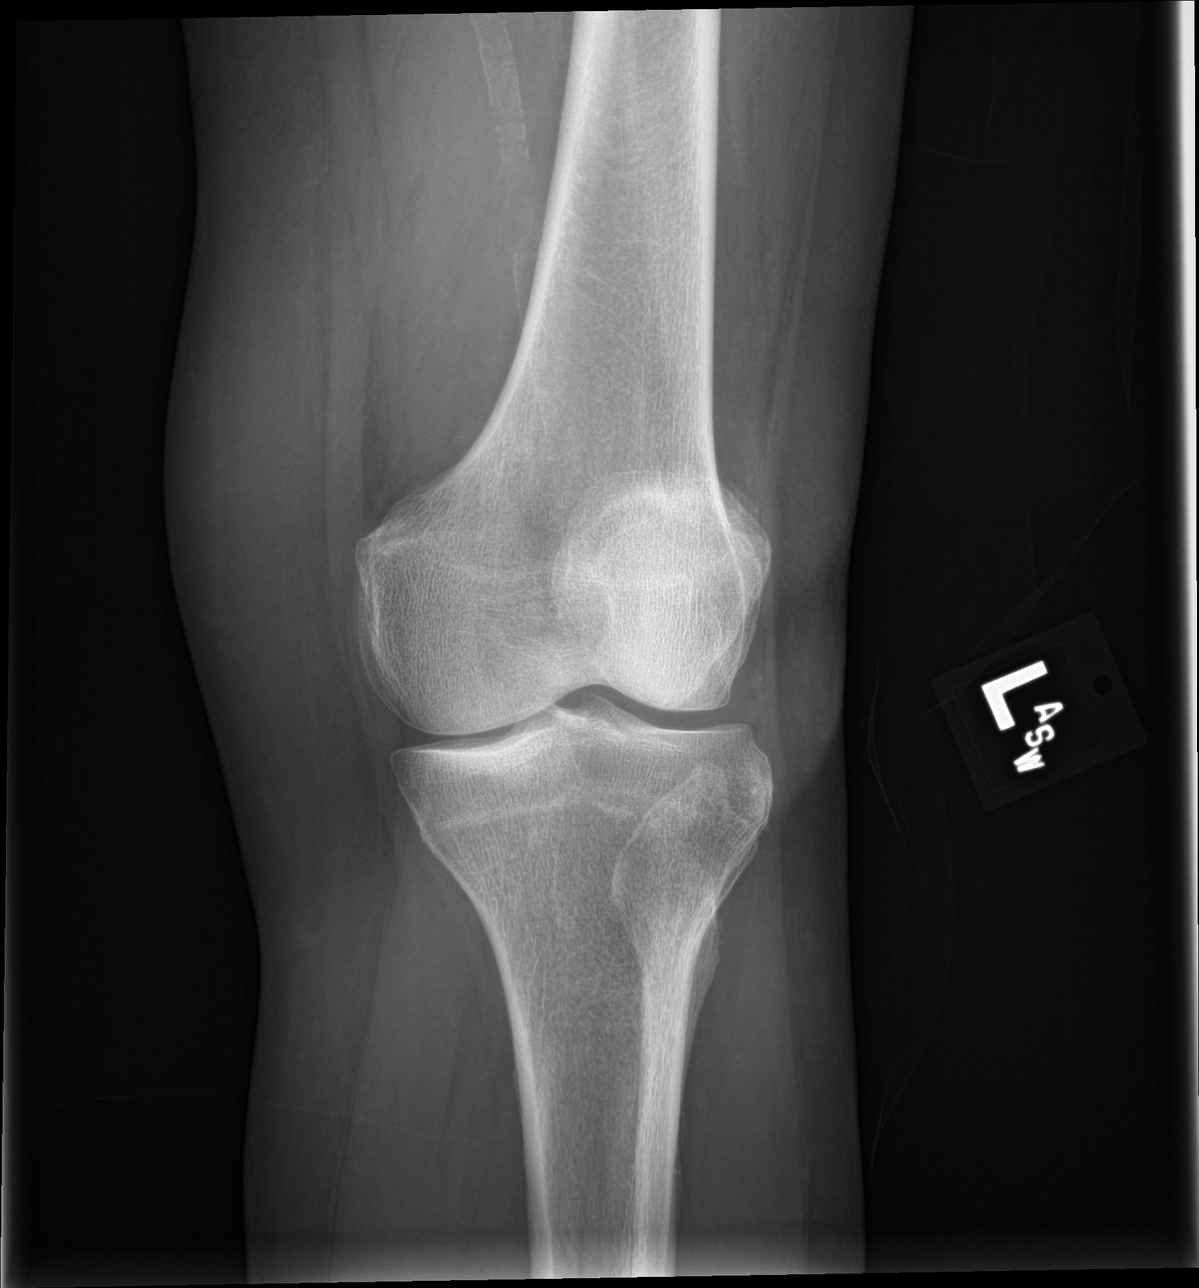

[knee obl (2 of 2)]
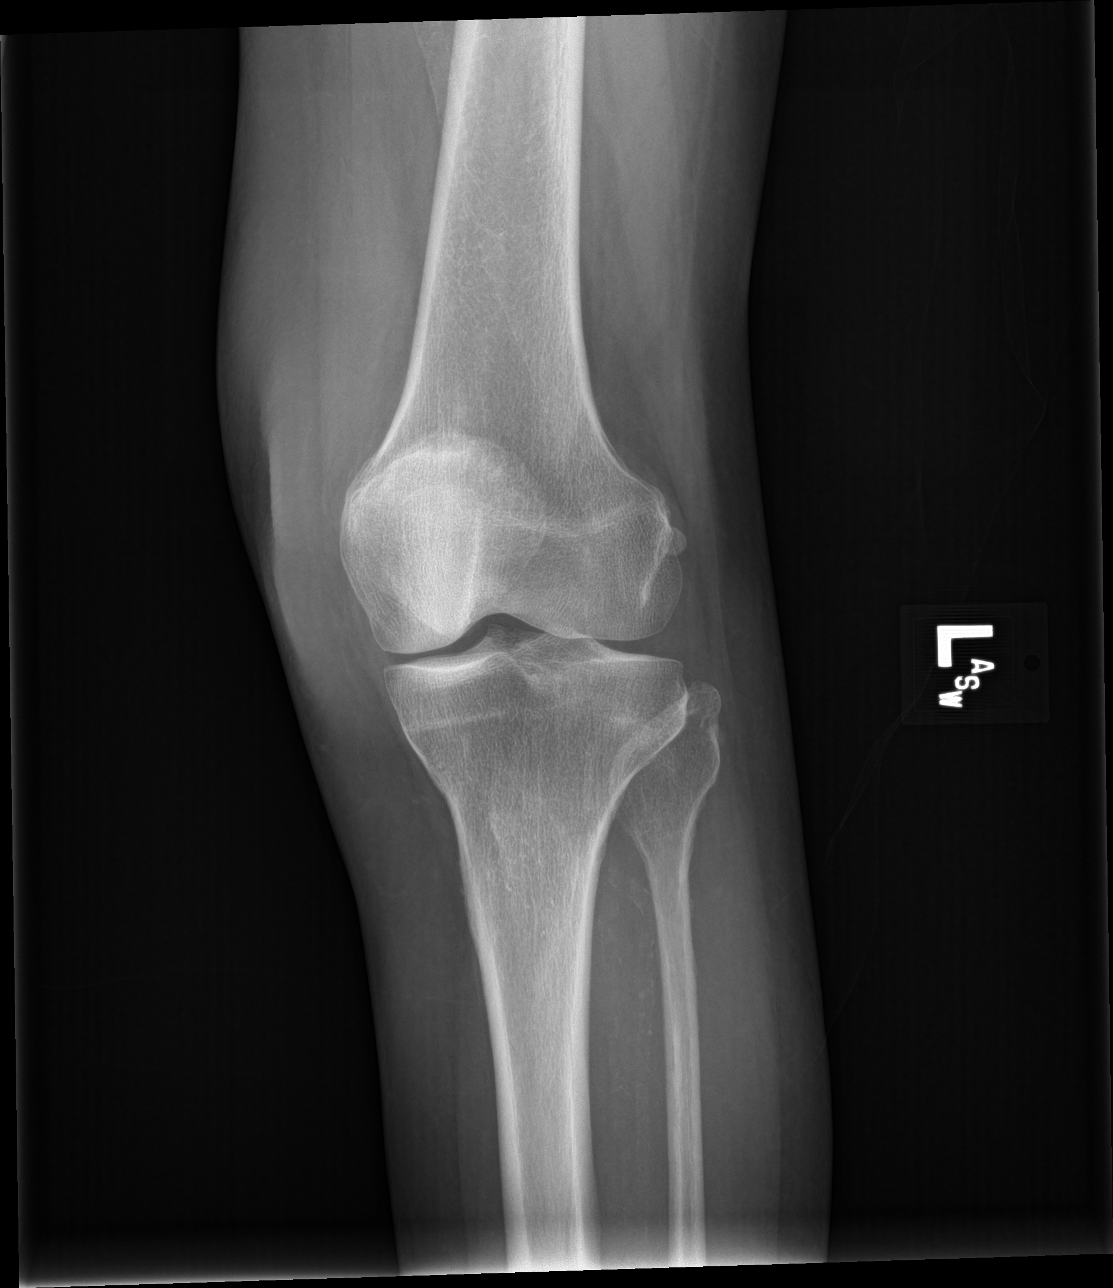

[4 of 4 positions shown; findings below may reference images not displayed]

FINDINGS: Vascular calcifications. Enthesopathic changes off the superior
patella. No fracture, dislocation, or joint effusion identified.
IMPRESSION: No acute abnormalities.

## 2023-01-19 ENCOUNTER — Other Ambulatory Visit: Payer: Self-pay | Admitting: Family Medicine

## 2023-01-19 DIAGNOSIS — N281 Cyst of kidney, acquired: Secondary | ICD-10-CM

## 2023-01-25 ENCOUNTER — Ambulatory Visit
Admission: RE | Admit: 2023-01-25 | Discharge: 2023-01-25 | Disposition: A | Discharge status: Home / Self Care | Payer: 59 | Source: Ambulatory Visit | Attending: Family Medicine | Admitting: Family Medicine

## 2023-01-25 DIAGNOSIS — N281 Cyst of kidney, acquired: Secondary | ICD-10-CM | POA: Insufficient documentation

## 2023-01-29 ENCOUNTER — Other Ambulatory Visit: Payer: Self-pay | Admitting: Family Medicine

## 2023-01-29 DIAGNOSIS — Z1231 Encounter for screening mammogram for malignant neoplasm of breast: Secondary | ICD-10-CM

## 2023-03-15 ENCOUNTER — Ambulatory Visit
Admission: RE | Admit: 2023-03-15 | Discharge: 2023-03-15 | Disposition: A | Discharge status: Home / Self Care | Payer: 59 | Source: Ambulatory Visit | Attending: Family Medicine | Admitting: Family Medicine

## 2023-03-15 DIAGNOSIS — Z1231 Encounter for screening mammogram for malignant neoplasm of breast: Secondary | ICD-10-CM | POA: Diagnosis present

## 2024-04-27 ENCOUNTER — Other Ambulatory Visit: Payer: Self-pay | Admitting: Family Medicine

## 2024-04-27 DIAGNOSIS — Z1231 Encounter for screening mammogram for malignant neoplasm of breast: Secondary | ICD-10-CM

## 2024-05-30 ENCOUNTER — Ambulatory Visit

## 2024-05-30 ENCOUNTER — Ambulatory Visit
Admission: RE | Admit: 2024-05-30 | Discharge: 2024-05-30 | Disposition: A | Discharge status: Home / Self Care | Source: Ambulatory Visit | Attending: Family Medicine | Admitting: Family Medicine

## 2024-05-30 DIAGNOSIS — Z1231 Encounter for screening mammogram for malignant neoplasm of breast: Secondary | ICD-10-CM | POA: Diagnosis present

## 2024-08-09 NOTE — Discharge Instructions (Signed)

## 2024-08-10 ENCOUNTER — Encounter: Payer: Self-pay | Admitting: Ophthalmology

## 2024-08-14 ENCOUNTER — Encounter: Admission: RE | Payer: Self-pay | Source: Home / Self Care

## 2024-08-14 ENCOUNTER — Ambulatory Visit: Admission: RE | Admit: 2024-08-14 | Admitting: Ophthalmology

## 2024-08-14 HISTORY — DX: Prediabetes: R73.03

## 2024-08-28 ENCOUNTER — Ambulatory Visit: Admit: 2024-08-28 | Admitting: Ophthalmology
